# Patient Record
Sex: Female | Born: 1935 | Race: Black or African American | Hispanic: No | Marital: Single | Smoking: Former smoker
Health system: Southern US, Community
[De-identification: ages and names within clinical notes are randomized; demographics above are authoritative.]

## PROBLEM LIST (undated history)

## (undated) DIAGNOSIS — F329 Major depressive disorder, single episode, unspecified: Secondary | ICD-10-CM

## (undated) DIAGNOSIS — R569 Unspecified convulsions: Secondary | ICD-10-CM

## (undated) DIAGNOSIS — K219 Gastro-esophageal reflux disease without esophagitis: Secondary | ICD-10-CM

## (undated) DIAGNOSIS — I10 Essential (primary) hypertension: Secondary | ICD-10-CM

## (undated) DIAGNOSIS — F0390 Unspecified dementia without behavioral disturbance: Secondary | ICD-10-CM

## (undated) DIAGNOSIS — R002 Palpitations: Secondary | ICD-10-CM

## (undated) DIAGNOSIS — D649 Anemia, unspecified: Secondary | ICD-10-CM

## (undated) DIAGNOSIS — R0603 Acute respiratory distress: Secondary | ICD-10-CM

## (undated) DIAGNOSIS — F32A Depression, unspecified: Secondary | ICD-10-CM

## (undated) DIAGNOSIS — D72819 Decreased white blood cell count, unspecified: Secondary | ICD-10-CM

## (undated) DIAGNOSIS — R413 Other amnesia: Secondary | ICD-10-CM

## (undated) HISTORY — PX: EYE SURGERY: SHX253

---

## 2004-04-04 ENCOUNTER — Other Ambulatory Visit: Payer: Self-pay

## 2004-04-04 ENCOUNTER — Emergency Department: Payer: Self-pay | Admitting: General Practice

## 2004-06-05 ENCOUNTER — Ambulatory Visit: Payer: Self-pay | Admitting: Specialist

## 2004-07-15 ENCOUNTER — Emergency Department: Payer: Self-pay | Admitting: Emergency Medicine

## 2004-07-15 ENCOUNTER — Other Ambulatory Visit: Payer: Self-pay

## 2004-09-14 ENCOUNTER — Other Ambulatory Visit: Payer: Self-pay

## 2004-09-14 ENCOUNTER — Inpatient Hospital Stay: Payer: Self-pay | Admitting: Anesthesiology

## 2004-09-14 ENCOUNTER — Emergency Department: Payer: Self-pay | Admitting: Emergency Medicine

## 2004-10-31 ENCOUNTER — Emergency Department: Payer: Self-pay | Admitting: Emergency Medicine

## 2004-10-31 ENCOUNTER — Other Ambulatory Visit: Payer: Self-pay

## 2004-12-29 ENCOUNTER — Ambulatory Visit: Payer: Self-pay

## 2005-02-08 ENCOUNTER — Ambulatory Visit: Payer: Self-pay | Admitting: Pain Medicine

## 2006-02-04 ENCOUNTER — Inpatient Hospital Stay: Payer: Self-pay | Admitting: Internal Medicine

## 2006-04-19 ENCOUNTER — Ambulatory Visit: Payer: Self-pay | Admitting: Gastroenterology

## 2006-05-10 ENCOUNTER — Emergency Department: Payer: Self-pay | Admitting: General Practice

## 2006-05-10 ENCOUNTER — Other Ambulatory Visit: Payer: Self-pay

## 2007-02-27 ENCOUNTER — Other Ambulatory Visit: Payer: Self-pay

## 2007-02-27 ENCOUNTER — Inpatient Hospital Stay: Payer: Self-pay | Admitting: Internal Medicine

## 2007-04-30 ENCOUNTER — Other Ambulatory Visit: Payer: Self-pay

## 2007-04-30 ENCOUNTER — Inpatient Hospital Stay: Payer: Self-pay | Admitting: Internal Medicine

## 2007-06-25 ENCOUNTER — Other Ambulatory Visit: Payer: Self-pay

## 2007-06-25 ENCOUNTER — Observation Stay: Payer: Self-pay | Admitting: Internal Medicine

## 2007-08-09 ENCOUNTER — Ambulatory Visit: Payer: Self-pay | Admitting: Internal Medicine

## 2007-08-11 ENCOUNTER — Ambulatory Visit: Payer: Self-pay | Admitting: Internal Medicine

## 2007-09-08 ENCOUNTER — Ambulatory Visit: Payer: Self-pay | Admitting: Internal Medicine

## 2007-10-30 ENCOUNTER — Ambulatory Visit: Payer: Self-pay | Admitting: Internal Medicine

## 2007-11-08 ENCOUNTER — Ambulatory Visit: Payer: Self-pay | Admitting: Internal Medicine

## 2007-12-29 ENCOUNTER — Ambulatory Visit: Payer: Self-pay | Admitting: Internal Medicine

## 2008-01-09 ENCOUNTER — Ambulatory Visit: Payer: Self-pay | Admitting: Internal Medicine

## 2008-02-21 ENCOUNTER — Ambulatory Visit: Payer: Self-pay | Admitting: Internal Medicine

## 2008-03-01 ENCOUNTER — Emergency Department: Payer: Self-pay | Admitting: Emergency Medicine

## 2008-07-18 ENCOUNTER — Emergency Department: Payer: Self-pay | Admitting: Emergency Medicine

## 2008-09-27 ENCOUNTER — Emergency Department: Payer: Self-pay | Admitting: Emergency Medicine

## 2008-10-28 ENCOUNTER — Emergency Department: Payer: Self-pay

## 2009-02-01 ENCOUNTER — Inpatient Hospital Stay: Payer: Self-pay | Admitting: Internal Medicine

## 2009-07-25 ENCOUNTER — Inpatient Hospital Stay: Payer: Self-pay | Admitting: Internal Medicine

## 2009-10-15 ENCOUNTER — Ambulatory Visit: Payer: Self-pay | Admitting: Internal Medicine

## 2009-10-18 ENCOUNTER — Inpatient Hospital Stay: Payer: Self-pay | Admitting: Internal Medicine

## 2010-11-12 ENCOUNTER — Inpatient Hospital Stay: Payer: Self-pay | Admitting: Internal Medicine

## 2011-06-05 ENCOUNTER — Emergency Department: Payer: Self-pay | Admitting: Internal Medicine

## 2011-06-05 LAB — DRUG SCREEN, URINE
Barbiturates, Ur Screen: NEGATIVE (ref ?–200)
Cannabinoid 50 Ng, Ur ~~LOC~~: NEGATIVE (ref ?–50)
Methadone, Ur Screen: NEGATIVE (ref ?–300)
Opiate, Ur Screen: NEGATIVE (ref ?–300)
Tricyclic, Ur Screen: NEGATIVE (ref ?–1000)

## 2011-06-05 LAB — URINALYSIS, COMPLETE
Ketone: NEGATIVE
Ph: 6 (ref 4.5–8.0)
Protein: NEGATIVE
RBC,UR: 7 /HPF (ref 0–5)
Squamous Epithelial: 4
WBC UR: 38 /HPF (ref 0–5)

## 2011-06-05 LAB — COMPREHENSIVE METABOLIC PANEL
Alkaline Phosphatase: 80 U/L (ref 50–136)
Anion Gap: 14 (ref 7–16)
BUN: 14 mg/dL (ref 7–18)
Calcium, Total: 8.9 mg/dL (ref 8.5–10.1)
Chloride: 104 mmol/L (ref 98–107)
Co2: 25 mmol/L (ref 21–32)
EGFR (Non-African Amer.): >60
SGOT(AST): 47 U/L — ABNORMAL HIGH (ref 15–37)
SGPT (ALT): 34 U/L

## 2011-06-05 LAB — CBC
HGB: 12.1 g/dL (ref 12.0–16.0)
MCH: 23.5 pg — ABNORMAL LOW (ref 26.0–34.0)
MCHC: 31.1 g/dL — ABNORMAL LOW (ref 32.0–36.0)
Platelet: 142 10*3/uL — ABNORMAL LOW (ref 150–440)
RBC: 5.16 10*6/uL (ref 3.80–5.20)

## 2011-06-05 LAB — PHENYTOIN LEVEL, TOTAL: Dilantin: 3.1 ug/mL — ABNORMAL LOW (ref 10.0–20.0)

## 2011-06-05 LAB — CK TOTAL AND CKMB (NOT AT ARMC)
CK, Total: 148 U/L (ref 21–215)
CK-MB: 1.8 ng/mL (ref 0.5–3.6)

## 2011-06-05 LAB — TROPONIN I: Troponin-I: <0.02 ng/mL

## 2015-01-30 ENCOUNTER — Encounter: Payer: Self-pay | Admitting: *Deleted

## 2015-02-04 ENCOUNTER — Ambulatory Visit: Payer: Medicare (Managed Care) | Admitting: Anesthesiology

## 2015-02-04 ENCOUNTER — Encounter
Admission: RE | Disposition: A | Discharge status: Home / Self Care | Payer: Self-pay | Source: Ambulatory Visit | Attending: Ophthalmology

## 2015-02-04 ENCOUNTER — Ambulatory Visit
Admission: RE | Admit: 2015-02-04 | Discharge: 2015-02-04 | Disposition: A | Discharge status: Home / Self Care | Payer: Medicare (Managed Care) | Source: Ambulatory Visit | Attending: Ophthalmology | Admitting: Ophthalmology

## 2015-02-04 ENCOUNTER — Encounter: Payer: Self-pay | Admitting: *Deleted

## 2015-02-04 DIAGNOSIS — R413 Other amnesia: Secondary | ICD-10-CM | POA: Diagnosis not present

## 2015-02-04 DIAGNOSIS — R002 Palpitations: Secondary | ICD-10-CM | POA: Diagnosis not present

## 2015-02-04 DIAGNOSIS — I10 Essential (primary) hypertension: Secondary | ICD-10-CM | POA: Insufficient documentation

## 2015-02-04 DIAGNOSIS — H2511 Age-related nuclear cataract, right eye: Secondary | ICD-10-CM | POA: Insufficient documentation

## 2015-02-04 DIAGNOSIS — Z87891 Personal history of nicotine dependence: Secondary | ICD-10-CM | POA: Diagnosis not present

## 2015-02-04 HISTORY — DX: Unspecified convulsions: R56.9

## 2015-02-04 HISTORY — DX: Essential (primary) hypertension: I10

## 2015-02-04 HISTORY — DX: Palpitations: R00.2

## 2015-02-04 HISTORY — DX: Anemia, unspecified: D64.9

## 2015-02-04 HISTORY — PX: CATARACT EXTRACTION W/PHACO: SHX586

## 2015-02-04 HISTORY — DX: Other amnesia: R41.3

## 2015-02-04 SURGERY — PHACOEMULSIFICATION, CATARACT, WITH IOL INSERTION
Anesthesia: Monitor Anesthesia Care | Laterality: Right | Wound class: Clean

## 2015-02-04 MED ORDER — NA CHONDROIT SULF-NA HYALURON 40-17 MG/ML IO SOLN
INTRAOCULAR | Status: AC
  Filled 2015-02-04: qty 1

## 2015-02-04 MED ORDER — SODIUM CHLORIDE 0.9 % IV SOLN
INTRAVENOUS | Status: DC
  Administered 2015-02-04 (×2): via INTRAVENOUS

## 2015-02-04 MED ORDER — PHENYLEPHRINE HCL 10 % OP SOLN
OPHTHALMIC | Status: AC
  Administered 2015-02-04: 3 [drp] via OPHTHALMIC
  Filled 2015-02-04: qty 5

## 2015-02-04 MED ORDER — TRYPAN BLUE 0.06 % OP SOLN
OPHTHALMIC | Status: AC
  Filled 2015-02-04: qty 0.5

## 2015-02-04 MED ORDER — MOXIFLOXACIN HCL 0.5 % OP SOLN
OPHTHALMIC | Status: AC
  Filled 2015-02-04: qty 3

## 2015-02-04 MED ORDER — TETRACAINE HCL 0.5 % OP SOLN
1.0000 [drp] | OPHTHALMIC | Status: AC | PRN
Start: 2015-02-04 — End: 2015-02-04
  Administered 2015-02-04: 1 [drp] via OPHTHALMIC

## 2015-02-04 MED ORDER — ARMC OPHTHALMIC DILATING GEL
OPHTHALMIC | Status: AC
  Administered 2015-02-04: 1 via OPHTHALMIC
  Filled 2015-02-04: qty 0.25

## 2015-02-04 MED ORDER — CEFUROXIME OPHTHALMIC INJECTION 1 MG/0.1 ML
INJECTION | OPHTHALMIC | Status: AC
  Filled 2015-02-04: qty 0.1

## 2015-02-04 MED ORDER — EPINEPHRINE HCL 1 MG/ML IJ SOLN
INTRAMUSCULAR | Status: DC | PRN
  Administered 2015-02-04: 200 mL via OPHTHALMIC

## 2015-02-04 MED ORDER — MOXIFLOXACIN HCL 0.5 % OP SOLN
OPHTHALMIC | Status: DC | PRN
  Administered 2015-02-04: 1 [drp] via OPHTHALMIC

## 2015-02-04 MED ORDER — EPINEPHRINE HCL 1 MG/ML IJ SOLN
INTRAMUSCULAR | Status: AC
  Filled 2015-02-04: qty 1

## 2015-02-04 MED ORDER — ARMC OPHTHALMIC DILATING GEL
1.0000 "application " | OPHTHALMIC | Status: AC | PRN
  Administered 2015-02-04 (×2): 1 via OPHTHALMIC

## 2015-02-04 MED ORDER — POVIDONE-IODINE 5 % OP SOLN
1.0000 "application " | OPHTHALMIC | Status: AC | PRN
  Administered 2015-02-04: 1 via OPHTHALMIC

## 2015-02-04 MED ORDER — PHENYLEPHRINE HCL 10 % OP SOLN
3.0000 [drp] | Freq: Once | OPHTHALMIC | Status: AC
  Administered 2015-02-04: 3 [drp] via OPHTHALMIC

## 2015-02-04 MED ORDER — CEFUROXIME OPHTHALMIC INJECTION 1 MG/0.1 ML
INJECTION | OPHTHALMIC | Status: DC | PRN
  Administered 2015-02-04: 1 mg via INTRACAMERAL

## 2015-02-04 MED ORDER — MOXIFLOXACIN HCL 0.5 % OP SOLN: 1.0000 [drp] | OPHTHALMIC | Status: DC | PRN

## 2015-02-04 MED ORDER — MIDAZOLAM HCL 2 MG/2ML IJ SOLN
INTRAMUSCULAR | Status: DC | PRN
  Administered 2015-02-04: 1 mg via INTRAVENOUS

## 2015-02-04 MED ORDER — NA CHONDROIT SULF-NA HYALURON 40-17 MG/ML IO SOLN
INTRAOCULAR | Status: DC | PRN
  Administered 2015-02-04: 1 mL via INTRAOCULAR

## 2015-02-04 MED ORDER — TETRACAINE HCL 0.5 % OP SOLN
OPHTHALMIC | Status: AC
  Administered 2015-02-04: 1 [drp] via OPHTHALMIC
  Filled 2015-02-04: qty 2

## 2015-02-04 MED ORDER — TRYPAN BLUE 0.06 % OP SOLN
OPHTHALMIC | Status: DC | PRN
  Administered 2015-02-04: 0.5 mL via INTRAOCULAR

## 2015-02-04 SURGICAL SUPPLY — 24 items
CANNULA ANT/CHMB 27GA (MISCELLANEOUS) ×3 IMPLANT
CUP MEDICINE 2OZ PLAST GRAD ST (MISCELLANEOUS) ×3 IMPLANT
FILTER MILLEX .045 (MISCELLANEOUS) ×1 IMPLANT
FLTR MILLEX .045 (MISCELLANEOUS) ×3
GLOVE BIO SURGEON STRL SZ8 (GLOVE) ×3 IMPLANT
GLOVE BIOGEL M 6.5 STRL (GLOVE) ×3 IMPLANT
GLOVE SURG LX 8.0 MICRO (GLOVE) ×2
GLOVE SURG LX STRL 8.0 MICRO (GLOVE) ×1 IMPLANT
GOWN STRL REUS W/ TWL LRG LVL3 (GOWN DISPOSABLE) ×2 IMPLANT
GOWN STRL REUS W/TWL LRG LVL3 (GOWN DISPOSABLE) ×4
LENS IOL TECNIS 23.5 (Intraocular Lens) ×3 IMPLANT
LENS IOL TECNIS MONO 1P 23.5 (Intraocular Lens) ×1 IMPLANT
PACK CATARACT (MISCELLANEOUS) ×3 IMPLANT
PACK CATARACT BRASINGTON LX (MISCELLANEOUS) ×3 IMPLANT
PACK EYE AFTER SURG (MISCELLANEOUS) ×3 IMPLANT
SOL BSS BAG (MISCELLANEOUS) ×3
SOL PREP PVP 2OZ (MISCELLANEOUS) ×3
SOLUTION BSS BAG (MISCELLANEOUS) ×1 IMPLANT
SOLUTION PREP PVP 2OZ (MISCELLANEOUS) ×1 IMPLANT
SYR 3ML LL SCALE MARK (SYRINGE) ×3 IMPLANT
SYR 5ML LL (SYRINGE) ×3 IMPLANT
SYR TB 1ML 27GX1/2 LL (SYRINGE) ×3 IMPLANT
WATER STERILE IRR 1000ML POUR (IV SOLUTION) ×3 IMPLANT
WIPE NON LINTING 3.25X3.25 (MISCELLANEOUS) ×3 IMPLANT

## 2015-02-04 NOTE — Op Note (Signed)
PREOPERATIVE DIAGNOSIS:  Nuclear sclerotic cataract of the right eye.   POSTOPERATIVE DIAGNOSIS: cataract right eye   OPERATIVE PROCEDURE:  Procedure(s): CATARACT EXTRACTION PHACO AND INTRAOCULAR LENS PLACEMENT (IOC)   SURGEON:  Galen Manila, MD.   ANESTHESIA:  Anesthesiologist: Lenard Simmer, MD CRNA: Mathews Argyle, CRNA  1.      Managed anesthesia care. 2.      Topical tetracaine drops followed by 2% Xylocaine jelly applied in the preoperative holding area.   COMPLICATIONS:  None.   TECHNIQUE:   Stop and chop   DESCRIPTION OF PROCEDURE:  The patient was examined and consented in the preoperative holding area where the aforementioned topical anesthesia was applied to the right eye and then brought back to the Operating Room where the right eye was prepped and draped in the usual sterile ophthalmic fashion and a lid speculum was placed. A paracentesis was created with the side port blade. Vision Blue was used to stain the anterior capsule due to very poor/ no visualization of the red reflex.  The anterior chamber was filled with viscoelastic. A near clear corneal incision was performed with the steel keratome. A continuous curvilinear capsulorrhexis was performed with a cystotome followed by the capsulorrhexis forceps. Hydrodissection and hydrodelineation were carried out with BSS on a blunt cannula. The lens was removed in a stop and chop  technique and the remaining cortical material was removed with the irrigation-aspiration handpiece. The capsular bag was inflated with viscoelastic and the Technis ZCB00  lens was placed in the capsular bag without complication. The remaining viscoelastic was removed from the eye with the irrigation-aspiration handpiece. The wounds were hydrated. The anterior chamber was flushed with Miostat and the eye was inflated to physiologic pressure. 0.1 mL of cefuroxime concentration 10 mg/mL was placed in the anterior chamber. The wounds were found to be water  tight. The eye was dressed with Vigamox. The patient was given protective glasses to wear throughout the day and a shield with which to sleep tonight. The patient was also given drops with which to begin a drop regimen today and will follow-up with me in one day.  Implant Name Type Inv. Item Serial No. Manufacturer Lot No. LRB No. Used  LENS IMPL INTRAOC ZCB00 23.5 - [id number] Intraocular Lens LENS IMPL INTRAOC ZCB00 23.5 [id number]   Right 1   Procedure(s) with comments: CATARACT EXTRACTION PHACO AND INTRAOCULAR LENS PLACEMENT (IOC) (Right) - [place] 1:16 ap 49.1 cde 19.71 fluid lot # [id number]  Electronically signed: PORFILIO,WILLIAM LOUIS 02/04/2015 11:44 AM

## 2015-02-04 NOTE — Transfer of Care (Signed)
Immediate Anesthesia Transfer of Care Note  Patient: Tammy Clayton  Procedure(s) Performed: Procedure(s) with comments: CATARACT EXTRACTION PHACO AND INTRAOCULAR LENS PLACEMENT (IOC) (Right) - [place] 1:16 ap 49.1 cde 19.71 fluid lot # [id number]  Patient Location: Short Stay  Anesthesia Type:MAC  Level of Consciousness: awake  Airway & Oxygen Therapy: Patient Spontanous Breathing  Post-op Assessment: Report given to RN  Post vital signs: Reviewed  Last Vitals:  Filed Vitals:   02/04/15 1147  BP: 159/84  Pulse: 62  Temp: 36.5 C  Resp: 16    Complications: No apparent anesthesia complications

## 2015-02-04 NOTE — H&P (Signed)
  All labs reviewed. Abnormal studies sent to patients PCP when indicated.  Previous H&P reviewed, patient examined, there are NO CHANGES.  Tammy Clayton LOUIS9/27/201610:49 AM

## 2015-02-04 NOTE — Discharge Instructions (Signed)
Eye Surgery Discharge Instructions  Expect mild scratchy sensation or mild soreness. DO NOT RUB YOUR EYE!  The day of surgery:  Minimal physical activity, but bed rest is not required  No reading, computer work, or close hand work  No bending, lifting, or straining.  May watch TV  For 24 hours:  No driving, legal decisions, or alcoholic beverages  Safety precautions  Eat anything you prefer: It is better to start with liquids, then soup then solid foods.  _____ Eye patch should be worn until postoperative exam tomorrow.  ____ Solar shield eyeglasses should be worn for comfort in the sunlight/patch while sleeping  Resume all regular medications including aspirin or Coumadin if these were discontinued prior to surgery. You may shower, bathe, shave, or wash your hair. Tylenol may be taken for mild discomfort.  Call your doctor if you experience significant pain, nausea, or vomiting, fever > 101 or other signs of infection. [phone] or [phone] Specific instructions:  Follow-up Information    Follow up with Carlena Bjornstad, MD.   Specialty:  Ophthalmology   Why:  9/28 at 1045   Contact information:   [address] [place] [address] [phone]

## 2015-02-04 NOTE — Anesthesia Postprocedure Evaluation (Signed)
  Anesthesia Post-op Note  Patient: Tammy Clayton  Procedure(s) Performed: Procedure(s) with comments: CATARACT EXTRACTION PHACO AND INTRAOCULAR LENS PLACEMENT (IOC) (Right) - [place] 1:16 ap 49.1 cde 19.71 fluid lot # [id number]  Anesthesia type:MAC  Patient location: short stay  Post pain: Pain level controlled  Post assessment: Post-op Vital signs reviewed, Patient's Cardiovascular Status Stable, Respiratory Function Stable, Patent Airway and No signs of Nausea or vomiting  Post vital signs: Reviewed and stable  Last Vitals:  Filed Vitals:   02/04/15 1147  BP: 159/84  Pulse: 62  Temp: 36.5 C  Resp: 16    Level of consciousness: awake, alert  and patient cooperative  Complications: No apparent anesthesia complications

## 2015-02-04 NOTE — Anesthesia Preprocedure Evaluation (Signed)
Anesthesia Evaluation  Patient identified by MRN, date of birth, ID band Patient awake    Reviewed: Allergy & Precautions, H&P , NPO status , Patient's Chart, lab work & pertinent test results, reviewed documented beta blocker date and time   History of Anesthesia Complications Negative for: history of anesthetic complications  Airway Mallampati: IV  TM Distance: >3 FB Neck ROM: full    Dental no notable dental hx. (+) Upper Dentures, Missing, Poor Dentition   Pulmonary neg shortness of breath, neg sleep apnea, neg COPD, neg recent URI, former smoker,    Pulmonary exam normal breath sounds clear to auscultation       Cardiovascular Exercise Tolerance: Good hypertension, On Medications (-) angina(-) CAD, (-) Past MI, (-) Cardiac Stents and (-) CABG Normal cardiovascular exam(-) dysrhythmias (-) Valvular Problems/Murmurs Rhythm:regular Rate:Normal     Neuro/Psych Seizures -, Well Controlled,  negative psych ROS   GI/Hepatic negative GI ROS, Neg liver ROS,   Endo/Other  negative endocrine ROS  Renal/GU negative Renal ROS  negative genitourinary   Musculoskeletal   Abdominal   Peds  Hematology negative hematology ROS (+)   Anesthesia Other Findings Past Medical History:   Hypertension                                                 Seizures                                                     Anemia                                                       Memory change                                                Palpitations                                                 Reproductive/Obstetrics negative OB ROS                             Anesthesia Physical Anesthesia Plan  ASA: II  Anesthesia Plan: MAC   Post-op Pain Management:    Induction:   Airway Management Planned:   Additional Equipment:   Intra-op Plan:   Post-operative Plan:   Informed Consent: I have reviewed the  patients History and Physical, chart, labs and discussed the procedure including the risks, benefits and alternatives for the proposed anesthesia with the patient or authorized representative who has indicated his/her understanding and acceptance.   Dental Advisory Given  Plan Discussed with: Anesthesiologist, CRNA and Surgeon  Anesthesia Plan Comments:         Anesthesia Quick Evaluation

## 2015-03-24 ENCOUNTER — Other Ambulatory Visit: Payer: Self-pay | Admitting: Family

## 2015-03-24 DIAGNOSIS — R569 Unspecified convulsions: Secondary | ICD-10-CM

## 2015-04-24 ENCOUNTER — Encounter: Payer: Self-pay | Admitting: *Deleted

## 2015-04-29 ENCOUNTER — Ambulatory Visit: Payer: Medicare (Managed Care) | Admitting: Certified Registered Nurse Anesthetist

## 2015-04-29 ENCOUNTER — Ambulatory Visit
Admission: RE | Admit: 2015-04-29 | Discharge: 2015-04-29 | Disposition: A | Discharge status: Home / Self Care | Payer: Medicare (Managed Care) | Source: Ambulatory Visit | Attending: Ophthalmology | Admitting: Ophthalmology

## 2015-04-29 ENCOUNTER — Encounter
Admission: RE | Disposition: A | Discharge status: Home / Self Care | Payer: Self-pay | Source: Ambulatory Visit | Attending: Ophthalmology

## 2015-04-29 ENCOUNTER — Encounter: Payer: Self-pay | Admitting: *Deleted

## 2015-04-29 DIAGNOSIS — F329 Major depressive disorder, single episode, unspecified: Secondary | ICD-10-CM | POA: Diagnosis not present

## 2015-04-29 DIAGNOSIS — D649 Anemia, unspecified: Secondary | ICD-10-CM | POA: Diagnosis not present

## 2015-04-29 DIAGNOSIS — F0390 Unspecified dementia without behavioral disturbance: Secondary | ICD-10-CM | POA: Diagnosis not present

## 2015-04-29 DIAGNOSIS — H2512 Age-related nuclear cataract, left eye: Secondary | ICD-10-CM | POA: Insufficient documentation

## 2015-04-29 DIAGNOSIS — Z9841 Cataract extraction status, right eye: Secondary | ICD-10-CM | POA: Diagnosis not present

## 2015-04-29 DIAGNOSIS — E7800 Pure hypercholesterolemia, unspecified: Secondary | ICD-10-CM | POA: Diagnosis not present

## 2015-04-29 DIAGNOSIS — K219 Gastro-esophageal reflux disease without esophagitis: Secondary | ICD-10-CM | POA: Diagnosis not present

## 2015-04-29 DIAGNOSIS — Z87891 Personal history of nicotine dependence: Secondary | ICD-10-CM | POA: Insufficient documentation

## 2015-04-29 DIAGNOSIS — I10 Essential (primary) hypertension: Secondary | ICD-10-CM | POA: Diagnosis not present

## 2015-04-29 HISTORY — PX: CATARACT EXTRACTION W/PHACO: SHX586

## 2015-04-29 HISTORY — DX: Gastro-esophageal reflux disease without esophagitis: K21.9

## 2015-04-29 HISTORY — DX: Acute respiratory distress: R06.03

## 2015-04-29 HISTORY — DX: Depression, unspecified: F32.A

## 2015-04-29 HISTORY — DX: Major depressive disorder, single episode, unspecified: F32.9

## 2015-04-29 HISTORY — DX: Unspecified dementia, unspecified severity, without behavioral disturbance, psychotic disturbance, mood disturbance, and anxiety: F03.90

## 2015-04-29 HISTORY — DX: Decreased white blood cell count, unspecified: D72.819

## 2015-04-29 SURGERY — PHACOEMULSIFICATION, CATARACT, WITH IOL INSERTION
Anesthesia: Monitor Anesthesia Care | Site: Eye | Laterality: Left | Wound class: Clean

## 2015-04-29 MED ORDER — NA CHONDROIT SULF-NA HYALURON 40-17 MG/ML IO SOLN
INTRAOCULAR | Status: DC | PRN
  Administered 2015-04-29: 1 mL via INTRAOCULAR

## 2015-04-29 MED ORDER — CARBACHOL 0.01 % IO SOLN
INTRAOCULAR | Status: DC | PRN
  Administered 2015-04-29: .5 mL via INTRAOCULAR

## 2015-04-29 MED ORDER — MOXIFLOXACIN HCL 0.5 % OP SOLN
OPHTHALMIC | Status: AC
  Filled 2015-04-29: qty 3

## 2015-04-29 MED ORDER — POVIDONE-IODINE 5 % OP SOLN
1.0000 | OPHTHALMIC | Status: AC | PRN
Start: 2015-04-29 — End: 2015-04-29

## 2015-04-29 MED ORDER — TETRACAINE HCL 0.5 % OP SOLN
OPHTHALMIC | Status: AC
  Administered 2015-04-29: 1 [drp] via OPHTHALMIC
  Filled 2015-04-29: qty 2

## 2015-04-29 MED ORDER — ARMC OPHTHALMIC DILATING GEL
1.0000 "application " | OPHTHALMIC | Status: DC | PRN
  Administered 2015-04-29 (×2): 1 via OPHTHALMIC

## 2015-04-29 MED ORDER — ARMC OPHTHALMIC DILATING GEL
OPHTHALMIC | Status: AC
  Administered 2015-04-29: 1 via OPHTHALMIC
  Filled 2015-04-29: qty 0.25

## 2015-04-29 MED ORDER — EPINEPHRINE HCL 1 MG/ML IJ SOLN
INTRAMUSCULAR | Status: AC
  Filled 2015-04-29: qty 1

## 2015-04-29 MED ORDER — MOXIFLOXACIN HCL 0.5 % OP SOLN
1.0000 [drp] | OPHTHALMIC | Status: DC | PRN
Start: 2015-04-29 — End: 2015-04-29

## 2015-04-29 MED ORDER — POVIDONE-IODINE 5 % OP SOLN
OPHTHALMIC | Status: AC
  Administered 2015-04-29: 1 via OPHTHALMIC
  Filled 2015-04-29: qty 30

## 2015-04-29 MED ORDER — NA CHONDROIT SULF-NA HYALURON 40-17 MG/ML IO SOLN
INTRAOCULAR | Status: AC
  Filled 2015-04-29: qty 1

## 2015-04-29 MED ORDER — SODIUM CHLORIDE 0.9 % IV SOLN
INTRAVENOUS | Status: DC
  Administered 2015-04-29: 08:00:00 via INTRAVENOUS

## 2015-04-29 MED ORDER — CEFUROXIME OPHTHALMIC INJECTION 1 MG/0.1 ML
INJECTION | OPHTHALMIC | Status: AC
  Filled 2015-04-29: qty 0.1

## 2015-04-29 MED ORDER — CEFUROXIME OPHTHALMIC INJECTION 1 MG/0.1 ML
INJECTION | OPHTHALMIC | Status: DC | PRN
  Administered 2015-04-29: .1 mL via INTRACAMERAL

## 2015-04-29 MED ORDER — EPINEPHRINE HCL 1 MG/ML IJ SOLN
INTRAOCULAR | Status: DC | PRN
  Administered 2015-04-29: 1 mL via OPHTHALMIC

## 2015-04-29 MED ORDER — MOXIFLOXACIN HCL 0.5 % OP SOLN
OPHTHALMIC | Status: DC | PRN
  Administered 2015-04-29: 1 [drp] via OPHTHALMIC

## 2015-04-29 MED ORDER — POVIDONE-IODINE 5 % OP SOLN
1.0000 "application " | OPHTHALMIC | Status: AC | PRN
  Administered 2015-04-29: 1 via OPHTHALMIC

## 2015-04-29 MED ORDER — TETRACAINE HCL 0.5 % OP SOLN
1.0000 [drp] | OPHTHALMIC | Status: AC | PRN
  Administered 2015-04-29: 1 [drp] via OPHTHALMIC

## 2015-04-29 MED ORDER — FENTANYL CITRATE (PF) 100 MCG/2ML IJ SOLN
INTRAMUSCULAR | Status: DC | PRN
  Administered 2015-04-29: 25 ug via INTRAVENOUS

## 2015-04-29 SURGICAL SUPPLY — 22 items
CANNULA ANT/CHMB 27GA (MISCELLANEOUS) ×3 IMPLANT
CUP MEDICINE 2OZ PLAST GRAD ST (MISCELLANEOUS) ×3 IMPLANT
GLOVE BIO SURGEON STRL SZ8 (GLOVE) ×3 IMPLANT
GLOVE BIOGEL M 6.5 STRL (GLOVE) ×3 IMPLANT
GLOVE SURG LX 8.0 MICRO (GLOVE) ×2
GLOVE SURG LX STRL 8.0 MICRO (GLOVE) ×1 IMPLANT
GOWN STRL REUS W/ TWL LRG LVL3 (GOWN DISPOSABLE) ×2 IMPLANT
GOWN STRL REUS W/TWL LRG LVL3 (GOWN DISPOSABLE) ×4
LENS IOL TECNIS 23.5 (Intraocular Lens) ×3 IMPLANT
LENS IOL TECNIS MONO 1P 23.5 (Intraocular Lens) ×1 IMPLANT
PACK CATARACT (MISCELLANEOUS) ×3 IMPLANT
PACK CATARACT BRASINGTON LX (MISCELLANEOUS) ×3 IMPLANT
PACK EYE AFTER SURG (MISCELLANEOUS) ×3 IMPLANT
SOL BSS BAG (MISCELLANEOUS) ×3
SOL PREP PVP 2OZ (MISCELLANEOUS) ×3
SOLUTION BSS BAG (MISCELLANEOUS) ×1 IMPLANT
SOLUTION PREP PVP 2OZ (MISCELLANEOUS) ×1 IMPLANT
SYR 3ML LL SCALE MARK (SYRINGE) ×3 IMPLANT
SYR 5ML LL (SYRINGE) ×3 IMPLANT
SYR TB 1ML 27GX1/2 LL (SYRINGE) ×3 IMPLANT
WATER STERILE IRR 1000ML POUR (IV SOLUTION) ×3 IMPLANT
WIPE NON LINTING 3.25X3.25 (MISCELLANEOUS) ×3 IMPLANT

## 2015-04-29 NOTE — Discharge Instructions (Signed)
Eye Surgery Discharge Instructions  Expect mild scratchy sensation or mild soreness. DO NOT RUB YOUR EYE!  The day of surgery:  Minimal physical activity, but bed rest is not required  No reading, computer work, or close hand work  No bending, lifting, or straining.  May watch TV  For 24 hours:  No driving, legal decisions, or alcoholic beverages  Safety precautions  Eat anything you prefer: It is better to start with liquids, then soup then solid foods.  _____ Eye patch should be worn until postoperative exam tomorrow.  ____ Solar shield eyeglasses should be worn for comfort in the sunlight/patch while sleeping  Resume all regular medications including aspirin or Coumadin if these were discontinued prior to surgery. You may shower, bathe, shave, or wash your hair. Tylenol may be taken for mild discomfort.  Call your doctor if you experience significant pain, nausea, or vomiting, fever > 101 or other signs of infection. [phone] or [phone] Specific instructions:  Follow-up Information    Follow up with Carlena BjornstadPORFILIO,WILLIAM LOUIS, MD On 04/30/2015.   Specialty:  Ophthalmology   Why:  1:20   Contact information:   [address] [place] [id number] [phone]

## 2015-04-29 NOTE — Transfer of Care (Signed)
Immediate Anesthesia Transfer of Care Note  Patient: Tammy Clayton  Procedure(s) Performed: Procedure(s) with comments: CATARACT EXTRACTION PHACO AND INTRAOCULAR LENS PLACEMENT (IOC) (Left) - US 00:53 AP% 21.6 CDE 11.51 fluid pack lot # [id number]  Patient Location: PACU  Anesthesia Type:MAC  Level of Consciousness: awake and alert   Airway & Oxygen Therapy: Patient Spontanous Breathing  Post-op Assessment: Report given to RN and Post -op Vital signs reviewed and stable  Post vital signs: Reviewed and stable  Last Vitals:  Filed Vitals:   04/29/15 0750 04/29/15 0925  BP: 137/82 169/76  Pulse: 62 66  Temp: 36.6 C 36.5 C  Resp: 18 18    Complications: No apparent anesthesia complications

## 2015-04-29 NOTE — Progress Notes (Signed)
[facility] dropped [facility] at [facility] per patient. Knew she was having cataract surgery, but didn't know it was today. No family with her at this time. Patient supplied number for her sister [name] who knew she was having surgery today and will be here around 10 am to go over discharge instructions and take patient home per sister [name]. Patient reoriented a few times during pre op.

## 2015-04-29 NOTE — Anesthesia Preprocedure Evaluation (Addendum)
Anesthesia Evaluation  Patient identified by MRN, date of birth, ID band Patient awake    Reviewed: Allergy & Precautions, NPO status , Patient's Chart, lab work & pertinent test results  Airway Mallampati: II  TM Distance: >3 FB Neck ROM: Full    Dental no notable dental hx.    Pulmonary former smoker,    Pulmonary exam normal        Cardiovascular hypertension, Pt. on medications Normal cardiovascular exam     Neuro/Psych Seizures -, Well Controlled,  Depression    GI/Hepatic Neg liver ROS, GERD  Medicated and Controlled,  Endo/Other  negative endocrine ROS  Renal/GU negative Renal ROS  negative genitourinary   Musculoskeletal   Abdominal Normal abdominal exam  (+)   Peds negative pediatric ROS (+)  Hematology  (+) anemia ,   Anesthesia Other Findings   Reproductive/Obstetrics                           Anesthesia Physical Anesthesia Plan  ASA: III  Anesthesia Plan: MAC   Post-op Pain Management:    Induction: Intravenous  Airway Management Planned: Nasal Cannula  Additional Equipment:   Intra-op Plan:   Post-operative Plan:   Informed Consent: I have reviewed the patients History and Physical, chart, labs and discussed the procedure including the risks, benefits and alternatives for the proposed anesthesia with the patient or authorized representative who has indicated his/her understanding and acceptance.   Dental advisory given  Plan Discussed with: CRNA and Surgeon  Anesthesia Plan Comments: (Patient does not cook for herself and is somewhat unreliable....swelling of the ankles sister cooks for her)       Anesthesia Quick Evaluation

## 2015-04-29 NOTE — H&P (Signed)
  All labs reviewed. Abnormal studies sent to patients PCP when indicated.  Previous H&P reviewed, patient examined, there are NO CHANGES.  Tammy Heuer LOUIS12/20/20169:23 AM

## 2015-04-29 NOTE — Progress Notes (Signed)
ANesthesia in to assess patient prior to surgery. Patient confused about eating this morning despite telling nurse this morning she has not ate since last night. RN called sister to verify whether or not she has had meals this morning. Sister [name] states that patient Tammy Clayton does not cook for herself or make meals that her family does this for her and her brother whom she lives with and that patient did not eat anything this morning nor have any of her medications. Sister [name] stated that she is on her way and will arrive at hospital within 30 minutes. Anesthesia notified of this and to proceed with case.

## 2015-04-29 NOTE — Anesthesia Postprocedure Evaluation (Signed)
Anesthesia Post Note  Patient: Tammy Clayton  Procedure(s) Performed: Procedure(s) (LRB): CATARACT EXTRACTION PHACO AND INTRAOCULAR LENS PLACEMENT (IOC) (Left)  Patient location during evaluation: PACU Anesthesia Type: MAC Level of consciousness: awake and alert Pain management: pain level controlled Vital Signs Assessment: post-procedure vital signs reviewed and stable Respiratory status: spontaneous breathing Cardiovascular status: stable Anesthetic complications: no    Last Vitals:  Filed Vitals:   04/29/15 0750 04/29/15 0925  BP: 137/82 169/76  Pulse: 62 66  Temp: 36.6 C 36.5 C  Resp: 18 18    Last Pain: There were no vitals filed for this visit.               Clydene PughBeane, Raiden Haydu D

## 2015-04-29 NOTE — Op Note (Signed)
PREOPERATIVE DIAGNOSIS:  Nuclear sclerotic cataract of the left eye.   POSTOPERATIVE DIAGNOSIS:  nuclear sclerotic cataract left eye   OPERATIVE PROCEDURE:  Procedure(s): CATARACT EXTRACTION PHACO AND INTRAOCULAR LENS PLACEMENT (IOC)   SURGEON:  Galen ManilaWilliam Cola Gane, MD.   ANESTHESIA:   Anesthesiologist: Yves DillPaul Carroll, MD CRNA: Malva Coganatherine Beane, CRNA  1.      Managed anesthesia care. 2.      Topical tetracaine drops followed by 2% Xylocaine jelly applied in the preoperative holding area.   COMPLICATIONS:  None.   TECHNIQUE:   Stop and chop   DESCRIPTION OF PROCEDURE:  The patient was examined and consented in the preoperative holding area where the aforementioned topical anesthesia was applied to the left eye and then brought back to the Operating Room where the left eye was prepped and draped in the usual sterile ophthalmic fashion and a lid speculum was placed. A paracentesis was created with the side port blade and the anterior chamber was filled with viscoelastic. A near clear corneal incision was performed with the steel keratome. A continuous curvilinear capsulorrhexis was performed with a cystotome followed by the capsulorrhexis forceps. Hydrodissection and hydrodelineation were carried out with BSS on a blunt cannula. The lens was removed in a stop and chop  technique and the remaining cortical material was removed with the irrigation-aspiration handpiece. The capsular bag was inflated with viscoelastic and the Technis ZCB00 lens was placed in the capsular bag without complication. The remaining viscoelastic was removed from the eye with the irrigation-aspiration handpiece. The wounds were hydrated. The anterior chamber was flushed with Miostat and the eye was inflated to physiologic pressure. 0.1 mL of cefuroxime concentration 10 mg/mL was placed in the anterior chamber. The wounds were found to be water tight. The eye was dressed with Vigamox. The patient was given protective glasses to wear  throughout the day and a shield with which to sleep tonight. The patient was also given drops with which to begin a drop regimen today and will follow-up with me in one day.  Implant Name Type Inv. Item Serial No. Manufacturer Lot No. LRB No. Used  LENS IMPL INTRAOC ZCB00 23.5 - [id number] Intraocular Lens LENS IMPL INTRAOC ZCB00 23.5 [id number] AMO   Left 1   Procedure(s) with comments: CATARACT EXTRACTION PHACO AND INTRAOCULAR LENS PLACEMENT (IOC) (Left) - US 00:53 AP% 21.6 CDE 11.51 fluid pack lot # [id number]  Electronically signed: Chi Woodham LOUIS 04/29/2015 9:24 AM

## 2015-04-29 NOTE — Anesthesia Procedure Notes (Signed)
Procedure Name: MAC Performed by: Anneliese Leblond Pre-anesthesia Checklist: Patient identified, Emergency Drugs available, Suction available, Patient being monitored and Timeout performed Oxygen Delivery Method: Nasal cannula       

## 2015-05-20 ENCOUNTER — Ambulatory Visit: Admission: RE | Admit: 2015-05-20 | Payer: Medicare (Managed Care) | Source: Ambulatory Visit

## 2017-03-20 ENCOUNTER — Other Ambulatory Visit: Payer: Self-pay

## 2017-03-20 ENCOUNTER — Emergency Department
Admission: EM | Admit: 2017-03-20 | Discharge: 2017-03-20 | Disposition: A | Discharge status: Home / Self Care | Payer: Medicare (Managed Care) | Attending: Emergency Medicine | Admitting: Emergency Medicine

## 2017-03-20 ENCOUNTER — Encounter: Payer: Self-pay | Admitting: Emergency Medicine

## 2017-03-20 ENCOUNTER — Emergency Department: Payer: Medicare (Managed Care)

## 2017-03-20 DIAGNOSIS — Z79899 Other long term (current) drug therapy: Secondary | ICD-10-CM | POA: Diagnosis not present

## 2017-03-20 DIAGNOSIS — Y998 Other external cause status: Secondary | ICD-10-CM | POA: Diagnosis not present

## 2017-03-20 DIAGNOSIS — Z87891 Personal history of nicotine dependence: Secondary | ICD-10-CM | POA: Insufficient documentation

## 2017-03-20 DIAGNOSIS — W19XXXA Unspecified fall, initial encounter: Secondary | ICD-10-CM | POA: Insufficient documentation

## 2017-03-20 DIAGNOSIS — S52502A Unspecified fracture of the lower end of left radius, initial encounter for closed fracture: Secondary | ICD-10-CM | POA: Diagnosis not present

## 2017-03-20 DIAGNOSIS — Y929 Unspecified place or not applicable: Secondary | ICD-10-CM | POA: Diagnosis not present

## 2017-03-20 DIAGNOSIS — Y939 Activity, unspecified: Secondary | ICD-10-CM | POA: Diagnosis not present

## 2017-03-20 DIAGNOSIS — S0990XA Unspecified injury of head, initial encounter: Secondary | ICD-10-CM | POA: Insufficient documentation

## 2017-03-20 DIAGNOSIS — S6992XA Unspecified injury of left wrist, hand and finger(s), initial encounter: Secondary | ICD-10-CM | POA: Diagnosis present

## 2017-03-20 MED ORDER — IBUPROFEN 600 MG PO TABS
600.0000 mg | ORAL_TABLET | Freq: Once | ORAL | Status: AC
  Administered 2017-03-20: 600 mg via ORAL
  Filled 2017-03-20: qty 1

## 2017-03-20 NOTE — ED Triage Notes (Signed)
Pt to ED via POV stating that she fell last night and has been having pain and swelling in her left wrist since falling. Pt states that she is unsure if she hit her head. Pt does take Aspirin 81 mg QD. Pt has significant swelling to her left wrist. Strong radial pulse noted, cap refill less than 3 seconds, skin warm to touch. Pt is in NAD at this time.

## 2017-03-20 NOTE — ED Notes (Signed)
Pt d/c with sister-guardian on file.

## 2017-03-20 NOTE — ED Provider Notes (Signed)
[facility] Emergency Department Provider Note ____________________________________________   I have reviewed the triage vital signs and the triage nursing note.  HISTORY  Chief Complaint Wrist Pain and Fall   Historian Patient and sister  HPI Tammy Clayton is a 83 y.o. female had a mechanical fall yesterday and left wrist is swollen significantly.  Unclear whether or not she struck her head, but it sounds like she did.  No other pains or extremity injuries other than that left wrist.  Pain is moderate.  Swelling is moderate.  Movement makes it worse.   Past Medical History:  Diagnosis Date  . Anemia   . Dementia   . Depression   . GERD (gastroesophageal reflux disease)   . Hypertension   . Leukopenia    chronic  . Memory change   . Palpitations   . Respiratory difficulty    h/o failure  . Seizures (HCC)     There are no active problems to display for this patient.   Past Surgical History:  Procedure Laterality Date  . EYE SURGERY      Prior to Admission medications   Medication Sig Start Date End Date Taking? Authorizing Provider  atorvastatin (LIPITOR) 40 MG tablet Take 40 mg by mouth daily.    [provider]  citalopram (CELEXA) 20 MG tablet Take 20 mg by mouth daily.    [provider]  ferrous sulfate 325 (65 FE) MG tablet Take 325 mg by mouth daily with breakfast.    [provider]  levETIRAcetam (KEPPRA) 750 MG tablet Take 750 mg by mouth 2 (two) times daily.    [provider]  loratadine (CLARITIN) 10 MG tablet Take 10 mg by mouth daily.    [provider]  meloxicam (MOBIC) 7.5 MG tablet Take 7.5 mg by mouth daily.    [provider]  omeprazole (PRILOSEC) 40 MG capsule Take 40 mg by mouth daily.    [provider]  phenytoin (DILANTIN) 100 MG ER capsule Take 300 mg by mouth at bedtime.     [provider]    No Known Allergies  No family history on  file.  Social History Social History   Tobacco Use  . Smoking status: Former Smoker  Substance Use Topics  . Alcohol use: No  . Drug use: No    Review of Systems  Constitutional: Negative for fever. Eyes: Negative for visual changes. ENT: Negative for sore throat. Cardiovascular: Negative for chest pain. Respiratory: Negative for shortness of breath. Gastrointestinal: Negative for abdominal pain, vomiting and diarrhea. Genitourinary: Negative for dysuria. Musculoskeletal: Negative for back pain. Skin: Negative for rash. Neurological: Negative for headache.  ____________________________________________   PHYSICAL EXAM:  VITAL SIGNS: ED Triage Vitals  Enc Vitals Group     BP 03/20/17 1157 (!) 143/116     Pulse Rate 03/20/17 1157 94     Resp 03/20/17 1157 16     Temp 03/20/17 1157 99.1 F (37.3 C)     Temp Source 03/20/17 1157 Oral     SpO2 03/20/17 1157 100 %     Weight 03/20/17 1157 137 lb (62.1 kg)     Height --      Head Circumference --      Peak Flow --      Pain Score 03/20/17 1156 10     Pain Loc --      Pain Edu? --      Excl. in GC? --  Constitutional: Alert and oriented. Well appearing and in no distress. HEENT   Head: Normocephalic and atraumatic.      Eyes: Conjunctivae are normal. Pupils equal and round.       Ears:         Nose: No congestion/rhinnorhea.   Mouth/Throat: Mucous membranes are moist.   Neck: No stridor. Cardiovascular/Chest: Normal rate, regular rhythm.  No murmurs, rubs, or gallops. Respiratory: Normal respiratory effort without tachypnea nor retractions. Breath sounds are clear and equal bilaterally. No wheezes/rales/rhonchi. Gastrointestinal: Soft. No distention, no guarding, no rebound. Nontender.    Genitourinary/rectal:Deferred Musculoskeletal: Left forearm swelling from the mid forearm into the hand.  Tenderness at the left wrist.  Neurovascularly intact.  Pelvis and hips are stable and nontender.  Right  upper extremity and lower extremity's are atraumatic. Neurologic:  Normal speech and language. No gross or focal neurologic deficits are appreciated. Skin:  Skin is warm, dry and intact. No rash noted. Psychiatric: Mood and affect are normal. Speech and behavior are normal. Patient exhibits appropriate insight and judgment.   ____________________________________________  LABS (pertinent positives/negatives) I, Governor Rooksebecca Raquon Milledge, MD the attending physician have reviewed the labs noted below.  Labs Reviewed - No data to display  ____________________________________________    EKG I, Governor Rooksebecca Damone Fancher, MD, the attending physician have personally viewed and interpreted all ECGs.  None ____________________________________________  RADIOLOGY All Xrays were viewed by me.  Imaging interpreted by Radiologist, and I, Governor Rooksebecca Caeley Dohrmann, MD the attending physician have reviewed the radiologist interpretation noted below.  CT head and C-spine without contrast:  IMPRESSION: 1. Atrophy and microvascular ischemic disease without superimposed acute intracranial process. 2. No fracture or static subluxation of the cervical spine. 3. Moderate to severe multilevel cervical spine DDD, worse at C5-C6. 4. Reversal of the expected cervical lordosis without associated anterolisthesis or retrolisthesis.  X-ray left wrist:  IMPRESSION: Intra-articular minimally displaced vertically oriented fracture through the distal left radial shaft.  Moderate degree of soft tissue swelling about the left wrist. __________________________________________  PROCEDURES  Procedure(s) performed: SPLINT APPLICATION Date/Time: 2:21 PM Authorized by: Governor Rooksebecca Olamide Lahaie Consent: Verbal consent obtained. Risks and benefits: risks, benefits and alternatives were discussed Consent given by: patient Splint applied by: orthopedic technician Location details: Left short arm volar Splint type: Left short arm volar Supplies used:  Ortho-Glass and Ace wrap Post-procedure: The splinted body part was neurovascularly unchanged following the procedure. Patient tolerance: Patient tolerated the procedure well with no immediate complications.     Critical Care performed: None   ____________________________________________  ED COURSE / ASSESSMENT AND PLAN  Pertinent labs & imaging results that were available during my care of the patient were reviewed by me and considered in my medical decision making (see chart for details).    Sister is also here, the fall was apparently mechanical, no concern for ongoing medical contributing symptoms or illnesses.  Trauma eval included head CT and cervical spine, which were negative for acute traumatic injury.  Left forearm is significantly swollen, and x-ray confirms intra-articular fracture which is minimally to nondisplaced.  Volar splint placed.    CONSULTATIONS:   Spoke with Dr. Allena KatzPatel, orthopedics, recommends splint and follow-up as an outpatient.   Patient / Family / Caregiver informed of clinical course, medical decision-making process, and agree with plan.   I discussed return precautions, follow-up instructions, and discharge instructions with patient and/or family.  Discharge Instructions : You have a left wrist fracture, keep in splint until seen in follow-up with the orthopedic surgeon in about 1  week.  Return to the emergency department immediately for any new or worsening conditions including numbness or tingling, worsening or uncontrolled pain, or any medical symptoms such as chest pain, trouble breathing, confusion altered mental status, or any other symptoms concerning to you.    ___________________________________________   FINAL CLINICAL IMPRESSION(S) / ED DIAGNOSES   Final diagnoses:  Closed fracture of distal end of left radius, unspecified fracture morphology, initial encounter      ___________________________________________  ED Discharge  Orders    None            Note: This dictation was prepared with Dragon dictation. Any transcriptional errors that result from this process are unintentional    Governor RooksLord, Harbert Fitterer, MD 03/20/17 1422

## 2017-03-20 NOTE — ED Notes (Signed)
Pt. Verbalizes understanding of d/c instructions, medications, and follow-up. VS stable.  Pt. In NAD at time of d/c and denies further concerns regarding this visit. Pt. Stable at the time of departure from the unit, departing unit by the safest and most appropriate manner per that pt condition and limitations with all belongings accounted for. Pt advised to return to the ED at any time for emergent concerns, or for new/worsening symptoms.   

## 2017-03-20 NOTE — Discharge Instructions (Signed)
You have a left wrist fracture, keep in splint until seen in follow-up with the orthopedic surgeon in about 1 week.  Return to the emergency department immediately for any new or worsening conditions including numbness or tingling, worsening or uncontrolled pain, or any medical symptoms such as chest pain, trouble breathing, confusion altered mental status, or any other symptoms concerning to you.

## 2019-11-02 ENCOUNTER — Other Ambulatory Visit: Payer: Self-pay

## 2019-11-02 ENCOUNTER — Encounter: Payer: Self-pay | Admitting: Internal Medicine

## 2019-11-02 ENCOUNTER — Inpatient Hospital Stay
Admission: AD | Admit: 2019-11-02 | Discharge: 2019-11-05 | DRG: 603 | Disposition: A | Discharge status: Skilled Nursing Facility | Payer: Medicare (Managed Care) | Attending: Internal Medicine | Admitting: Internal Medicine

## 2019-11-02 ENCOUNTER — Emergency Department: Payer: Medicare (Managed Care)

## 2019-11-02 DIAGNOSIS — R509 Fever, unspecified: Secondary | ICD-10-CM

## 2019-11-02 DIAGNOSIS — Z20822 Contact with and (suspected) exposure to covid-19: Secondary | ICD-10-CM | POA: Diagnosis present

## 2019-11-02 DIAGNOSIS — G309 Alzheimer's disease, unspecified: Secondary | ICD-10-CM | POA: Diagnosis present

## 2019-11-02 DIAGNOSIS — F03918 Unspecified dementia, unspecified severity, with other behavioral disturbance: Secondary | ICD-10-CM

## 2019-11-02 DIAGNOSIS — R52 Pain, unspecified: Secondary | ICD-10-CM

## 2019-11-02 DIAGNOSIS — I10 Essential (primary) hypertension: Secondary | ICD-10-CM

## 2019-11-02 DIAGNOSIS — F0390 Unspecified dementia without behavioral disturbance: Secondary | ICD-10-CM

## 2019-11-02 DIAGNOSIS — F05 Delirium due to known physiological condition: Secondary | ICD-10-CM

## 2019-11-02 DIAGNOSIS — L03113 Cellulitis of right upper limb: Secondary | ICD-10-CM | POA: Diagnosis not present

## 2019-11-02 DIAGNOSIS — F0281 Dementia in other diseases classified elsewhere with behavioral disturbance: Secondary | ICD-10-CM | POA: Diagnosis present

## 2019-11-02 DIAGNOSIS — F329 Major depressive disorder, single episode, unspecified: Secondary | ICD-10-CM | POA: Diagnosis present

## 2019-11-02 DIAGNOSIS — G40909 Epilepsy, unspecified, not intractable, without status epilepticus: Secondary | ICD-10-CM

## 2019-11-02 DIAGNOSIS — K219 Gastro-esophageal reflux disease without esophagitis: Secondary | ICD-10-CM | POA: Diagnosis present

## 2019-11-02 DIAGNOSIS — Z79899 Other long term (current) drug therapy: Secondary | ICD-10-CM

## 2019-11-02 DIAGNOSIS — Z87891 Personal history of nicotine dependence: Secondary | ICD-10-CM

## 2019-11-02 DIAGNOSIS — R296 Repeated falls: Secondary | ICD-10-CM

## 2019-11-02 LAB — URINALYSIS, COMPLETE (UACMP) WITH MICROSCOPIC
Bilirubin Urine: NEGATIVE
Glucose, UA: NEGATIVE mg/dL
Ketones, ur: 5 mg/dL — AB
Leukocytes,Ua: NEGATIVE
Nitrite: NEGATIVE
Protein, ur: NEGATIVE mg/dL
Specific Gravity, Urine: 1.009 (ref 1.005–1.030)
WBC, UA: NONE SEEN WBC/hpf (ref 0–5)
pH: 7 (ref 5.0–8.0)

## 2019-11-02 LAB — COMPREHENSIVE METABOLIC PANEL
ALT: 14 U/L (ref 0–44)
AST: 26 U/L (ref 15–41)
Albumin: 3.8 g/dL (ref 3.5–5.0)
Alkaline Phosphatase: 102 U/L (ref 38–126)
Anion gap: 11 (ref 5–15)
BUN: 12 mg/dL (ref 8–23)
CO2: 30 mmol/L (ref 22–32)
Calcium: 9.2 mg/dL (ref 8.9–10.3)
Chloride: 96 mmol/L — ABNORMAL LOW (ref 98–111)
Creatinine, Ser: 0.82 mg/dL (ref 0.44–1.00)
GFR calc Af Amer: >60 mL/min (ref >60)
GFR calc non Af Amer: >60 mL/min (ref >60)
Glucose, Bld: 120 mg/dL — ABNORMAL HIGH (ref 70–99)
Potassium: 3 mmol/L — ABNORMAL LOW (ref 3.5–5.1)
Sodium: 137 mmol/L (ref 135–145)
Total Bilirubin: 0.8 mg/dL (ref 0.3–1.2)
Total Protein: 9 g/dL — ABNORMAL HIGH (ref 6.5–8.1)

## 2019-11-02 LAB — CBC
HCT: 36.8 % (ref 36.0–46.0)
Hemoglobin: 11.4 g/dL — ABNORMAL LOW (ref 12.0–15.0)
MCH: 23.3 pg — ABNORMAL LOW (ref 26.0–34.0)
MCHC: 31 g/dL (ref 30.0–36.0)
MCV: 75.1 fL — ABNORMAL LOW (ref 80.0–100.0)
Platelets: 186 10*3/uL (ref 150–400)
RBC: 4.9 MIL/uL (ref 3.87–5.11)
RDW: 15.5 % (ref 11.5–15.5)
WBC: 7.8 10*3/uL (ref 4.0–10.5)
nRBC: 0 % (ref 0.0–0.2)

## 2019-11-02 LAB — LACTIC ACID, PLASMA
Lactic Acid, Venous: 1.3 mmol/L (ref 0.5–1.9)
Lactic Acid, Venous: 1 mmol/L (ref 0.5–1.9)

## 2019-11-02 MED ORDER — ENOXAPARIN SODIUM 40 MG/0.4ML ~~LOC~~ SOLN
40.0000 mg | SUBCUTANEOUS | Status: DC
  Administered 2019-11-02 – 2019-11-04 (×3): 40 mg via SUBCUTANEOUS
  Filled 2019-11-02 (×3): qty 0.4

## 2019-11-02 MED ORDER — LEVETIRACETAM 750 MG PO TABS
750.0000 mg | ORAL_TABLET | Freq: Two times a day (BID) | ORAL | Status: DC
  Administered 2019-11-02 – 2019-11-05 (×6): 750 mg via ORAL
  Filled 2019-11-02 (×9): qty 1

## 2019-11-02 MED ORDER — ACETAMINOPHEN 650 MG RE SUPP: 650.0000 mg | Freq: Four times a day (QID) | RECTAL | Status: DC | PRN

## 2019-11-02 MED ORDER — VANCOMYCIN HCL IN DEXTROSE 1-5 GM/200ML-% IV SOLN
1000.0000 mg | Freq: Once | INTRAVENOUS | Status: AC
  Administered 2019-11-02: 1000 mg via INTRAVENOUS
  Filled 2019-11-02: qty 200

## 2019-11-02 MED ORDER — SODIUM CHLORIDE 0.9 % IV SOLN
1.0000 g | Freq: Once | INTRAVENOUS | Status: AC
  Administered 2019-11-02: 1 g via INTRAVENOUS
  Filled 2019-11-02: qty 10

## 2019-11-02 MED ORDER — ONDANSETRON HCL 4 MG PO TABS: 4.0000 mg | ORAL_TABLET | Freq: Four times a day (QID) | ORAL | Status: DC | PRN

## 2019-11-02 MED ORDER — POTASSIUM CHLORIDE CRYS ER 20 MEQ PO TBCR
40.0000 meq | EXTENDED_RELEASE_TABLET | Freq: Once | ORAL | Status: AC
  Administered 2019-11-02: 40 meq via ORAL
  Filled 2019-11-02: qty 2

## 2019-11-02 MED ORDER — ACETAMINOPHEN 325 MG PO TABS
650.0000 mg | ORAL_TABLET | Freq: Once | ORAL | Status: AC
  Administered 2019-11-02: 650 mg via ORAL
  Filled 2019-11-02: qty 2

## 2019-11-02 MED ORDER — LORAZEPAM 2 MG/ML IJ SOLN
1.0000 mg | Freq: Once | INTRAMUSCULAR | Status: AC
  Administered 2019-11-02: 1 mg via INTRAVENOUS
  Filled 2019-11-02: qty 1

## 2019-11-02 MED ORDER — ONDANSETRON HCL 4 MG/2ML IJ SOLN: 4.0000 mg | Freq: Four times a day (QID) | INTRAMUSCULAR | Status: DC | PRN

## 2019-11-02 MED ORDER — LABETALOL HCL 5 MG/ML IV SOLN
10.0000 mg | INTRAVENOUS | Status: DC | PRN
  Administered 2019-11-02: 10 mg via INTRAVENOUS
  Filled 2019-11-02: qty 4

## 2019-11-02 MED ORDER — ACETAMINOPHEN 325 MG PO TABS
650.0000 mg | ORAL_TABLET | Freq: Four times a day (QID) | ORAL | Status: DC | PRN
  Administered 2019-11-03 – 2019-11-04 (×3): 650 mg via ORAL
  Filled 2019-11-02 (×3): qty 2

## 2019-11-02 MED ORDER — SODIUM CHLORIDE 0.9 % IV SOLN
1.0000 g | INTRAVENOUS | Status: DC
  Administered 2019-11-03 – 2019-11-04 (×2): 1 g via INTRAVENOUS
  Filled 2019-11-02: qty 10
  Filled 2019-11-02 (×2): qty 1

## 2019-11-02 MED ORDER — PHENYTOIN SODIUM EXTENDED 100 MG PO CAPS
200.0000 mg | ORAL_CAPSULE | Freq: Every day | ORAL | Status: DC
  Administered 2019-11-02 – 2019-11-04 (×3): 200 mg via ORAL
  Filled 2019-11-02 (×5): qty 2

## 2019-11-02 NOTE — ED Notes (Signed)
1st set of blood cultures sent, pt's sister here to be with her

## 2019-11-02 NOTE — H&P (Signed)
History and Physical    [place] UXL:[id number] DOB: [birth year] DOA: 11/02/2019  PCP: System, Pcp Not In   [facility] [place] [facility]  I have personally briefly reviewed patient's old medical records in [facility]  Chief Complaint: Confusion, right arm pain  HPI: Tammy Clayton is a 86 y.o. female with medical history significant for hypertension, seizure disorder, Alzheimer's dementia, and history of frequent falls who was sent to the emergency room for evaluation of a 3-day history of pain in the right arm and agitation beyond her baseline for dementia.  She has had no fever or chills, no reports of vomiting or diarrhea or abdominal pain.  No recent falls.  Denies cough, chest pain and shortness of breath.  She was in her usual state of health until she developed right forearm pain about 3 days prior.  Due to history of dementia most of history taken from emergency room records and reports from [facility] ED Course: On arrival she was febrile at 101.4, heart rate 85, BP 184/85 O2 sat normal on room air.  Blood work was for the most part unremarkable except for a low potassium of 3.  Urinalysis was mostly normal.  Chest x-ray showed no acute disease.  She had x-rays of her right shoulder elbow and wrist with no evidence of acute fracture or dislocation but she did have some swelling of the dorsal aspect of the right forearm.  Patient started on Rocephin in the ER for cellulitis of the right arm.  Hospitalist consulted for admission.  Review of Systems: Unable to obtain due to dementia and confusion   Past Medical History:  Diagnosis Date  . Anemia   . Dementia   . Depression   . GERD (gastroesophageal reflux disease)   . Hypertension   . Leukopenia    chronic  . Memory change   . Palpitations   . Respiratory difficulty    h/o failure  . Seizures (HCC)     Past Surgical History:  Procedure Laterality Date  . CATARACT EXTRACTION W/PHACO Right  02/04/2015   Procedure: CATARACT EXTRACTION PHACO AND INTRAOCULAR LENS PLACEMENT (IOC);  Surgeon: Galen Manila, MD;  Location: [facility] ORS;  Service: Ophthalmology;  Laterality: Right;  [place] 1:16 ap 49.1 cde 19.71 fluid lot # [id number]  . CATARACT EXTRACTION W/PHACO Left 04/29/2015   Procedure: CATARACT EXTRACTION PHACO AND INTRAOCULAR LENS PLACEMENT (IOC);  Surgeon: Galen Manila, MD;  Location: [facility] ORS;  Service: Ophthalmology;  Laterality: Left;  [place] 00:53 AP% 21.6 CDE 11.51 fluid pack lot # [id number]  . EYE SURGERY       reports that she has quit smoking. She does not have any smokeless tobacco history on file. She reports that she does not drink alcohol and does not use drugs.  No Known Allergies  Family history: Unable to obtain due to patient's dementia   Prior to Admission medications   Medication Sig Start Date End Date Taking? Authorizing Provider  acetaminophen (TYLENOL) 325 MG tablet Take 650 mg by mouth every 6 (six) hours as needed for mild pain or moderate pain.   Yes [provider]  citalopram (CELEXA) 20 MG tablet Take 20 mg by mouth daily.   Yes [provider]  diclofenac Sodium (VOLTAREN) 1 % GEL Apply 2 g topically 3 (three) times daily as needed (wrist pain).   Yes [provider]  levETIRAcetam (KEPPRA) 750 MG tablet Take 750 mg by mouth 2 (two) times daily.   Yes  [provider]  lisinopril (ZESTRIL) 20 MG tablet Take 20 mg by mouth daily.   Yes [provider]  omeprazole (PRILOSEC) 40 MG capsule Take 40 mg by mouth daily.   Yes [provider]  phenytoin (DILANTIN) 100 MG ER capsule Take 200 mg by mouth at bedtime.    Yes [provider]  polyethylene glycol (MIRALAX / GLYCOLAX) 17 g packet Take 17 g by mouth daily as needed for mild constipation or moderate constipation.   Yes [provider]  traZODone (DESYREL) 50 MG tablet Take 50 mg by mouth at bedtime.   Yes [provider]     Physical Exam: Vitals:   11/02/19 1617 11/02/19 1800 11/02/19 1832 11/02/19 1922  BP:    (!) 190/92  Pulse:    86  Resp:  16  18  Temp:   (!) 100.8 F (38.2 C)   TempSrc:   Oral   SpO2:    96%  Weight: 63.5 kg     Height: 5\' 2"  (1.575 m)        Vitals:   11/02/19 1617 11/02/19 1800 11/02/19 1832 11/02/19 1922  BP:    (!) 190/92  Pulse:    86  Resp:  16  18  Temp:   (!) 100.8 F (38.2 C)   TempSrc:   Oral   SpO2:    96%  Weight: 63.5 kg     Height: 5\' 2"  (1.575 m)         Constitutional: Alert, but very confused and restless and oriented x 1 . Not in any apparent distress HEENT:      Head: Normocephalic and atraumatic.         Eyes: PERLA, EOMI, Conjunctivae are normal. Sclera is non-icteric.       Mouth/Throat: Mucous membranes are moist.       Neck: Supple with no signs of meningismus. Cardiovascular: Regular rate and rhythm. No murmurs, gallops, or rubs. 2+ symmetrical distal pulses are present . No JVD. No LE edema Respiratory: Respiratory effort normal .Lungs sounds clear bilaterally. No wheezes, crackles, or rhonchi.  Gastrointestinal: Soft, non tender, and non distended with positive bowel sounds. No rebound or guarding. Genitourinary: No CVA tenderness. Musculoskeletal:  Pain with range of motion of right shoulder and right wrist.  Swelling dorsum of right hand but without warmth. Neurologic: Normal speech and language. Face is symmetric. Moving all extremities. No gross focal neurologic deficits . Skin: Skin is warm, dry.  No rash or ulcers Psychiatric: Somewhat restless.  Difficult to assess due to confusion   Labs on Admission: I have personally reviewed following labs and imaging studies  CBC: Recent Labs  Lab 11/02/19 1628  WBC 7.8  HGB 11.4*  HCT 36.8  MCV 75.1*  PLT 186   Basic Metabolic Panel: Recent Labs  Lab 11/02/19 1628  NA 137  K 3.0*  CL 96*  CO2 30  GLUCOSE 120*  BUN 12  CREATININE 0.82  CALCIUM 9.2   GFR: Estimated  Creatinine Clearance: 43.9 mL/min (by C-G formula based on SCr of 0.82 mg/dL). Liver Function Tests: Recent Labs  Lab 11/02/19 1628  AST 26  ALT 14  ALKPHOS 102  BILITOT 0.8  PROT 9.0*  ALBUMIN 3.8   No results for input(s): LIPASE, AMYLASE in the last 168 hours. No results for input(s): AMMONIA in the last 168 hours. Coagulation Profile: No results for input(s): INR, PROTIME in the last 168 hours. Cardiac Enzymes: No results for input(s):  CKTOTAL, CKMB, CKMBINDEX, TROPONINI in the last 168 hours. BNP (last 3 results) No results for input(s): PROBNP in the last 8760 hours. HbA1C: No results for input(s): HGBA1C in the last 72 hours. CBG: No results for input(s): GLUCAP in the last 168 hours. Lipid Profile: No results for input(s): CHOL, HDL, LDLCALC, TRIG, CHOLHDL, LDLDIRECT in the last 72 hours. Thyroid Function Tests: No results for input(s): TSH, T4TOTAL, FREET4, T3FREE, THYROIDAB in the last 72 hours. Anemia Panel: No results for input(s): VITAMINB12, FOLATE, FERRITIN, TIBC, IRON, RETICCTPCT in the last 72 hours. Urine analysis:    Component Value Date/Time   COLORURINE YELLOW (A) 11/02/2019 1910   APPEARANCEUR CLEAR (A) 11/02/2019 1910   APPEARANCEUR Cloudy 06/05/2011 1321   LABSPEC 1.009 11/02/2019 1910   LABSPEC 1.016 06/05/2011 1321   PHURINE 7.0 11/02/2019 1910   GLUCOSEU NEGATIVE 11/02/2019 1910   GLUCOSEU Negative 06/05/2011 1321   HGBUR SMALL (A) 11/02/2019 1910   BILIRUBINUR NEGATIVE 11/02/2019 1910   BILIRUBINUR Negative 06/05/2011 1321   KETONESUR 5 (A) 11/02/2019 1910   PROTEINUR NEGATIVE 11/02/2019 1910   NITRITE NEGATIVE 11/02/2019 1910   LEUKOCYTESUR NEGATIVE 11/02/2019 1910   LEUKOCYTESUR 2+ 06/05/2011 1321    Radiological Exams on Admission: DG Chest 1 View  Result Date: 11/02/2019 CLINICAL DATA:  History of multiple falls. EXAM: CHEST  1 VIEW COMPARISON:  June 05, 2011 FINDINGS: There is no evidence of acute infiltrate, pleural effusion  or pneumothorax. The heart size and mediastinal contours are within normal limits. An intact left shoulder replacement is noted. No acute osseous abnormalities are seen. IMPRESSION: No active disease. Electronically Signed   By: Aram Candela M.D.   On: 11/02/2019 18:52   DG Shoulder Right  Result Date: 11/02/2019 CLINICAL DATA:  History of multiple falls. EXAM: RIGHT SHOULDER - 2+ VIEW COMPARISON:  None. FINDINGS: There is no evidence of an acute fracture or dislocation. A chronic seventh right rib deformity is seen. Moderate severity chronic and degenerative changes seen involving the right acromioclavicular joint and right glenohumeral articulation. Soft tissues are unremarkable. IMPRESSION: Chronic and degenerative changes without evidence of an acute fracture or dislocation. Electronically Signed   By: Aram Candela M.D.   On: 11/02/2019 18:50   DG Elbow 2 Views Right  Result Date: 11/02/2019 CLINICAL DATA:  Status post trauma. EXAM: RIGHT ELBOW - 2 VIEW COMPARISON:  None. FINDINGS: There is no evidence of fracture, dislocation, or joint effusion. There is no evidence of arthropathy or other focal bone abnormality. There is mild dorsal soft tissue swelling. IMPRESSION: Mild dorsal soft tissue swelling without evidence of an acute fracture or dislocation. Electronically Signed   By: Aram Candela M.D.   On: 11/02/2019 18:51   DG Wrist Complete Right  Result Date: 11/02/2019 CLINICAL DATA:  History of multiple falls. EXAM: RIGHT WRIST - COMPLETE 3+ VIEW COMPARISON:  None. FINDINGS: There is no evidence of an acute fracture or dislocation. Marked severity degenerative changes are seen involving the carpometacarpal articulation of the left thumb. Mild diffuse degenerative changes are also seen throughout the right wrist. Soft tissues are unremarkable. IMPRESSION: Advanced degenerative changes at the carpometacarpal articulation of the left thumb. Electronically Signed   By: Aram Candela  M.D.   On: 11/02/2019 18:49    EKG: Independently reviewed. Interpretation :   Assessment/Plan  86 year old female with history of hypertension and dementia presenting with 3-day history of right wrist pain associated with increased agitation    ?Cellulitis of right hand -Patient with noted  swelling of the dorsum of the right hand, though without redness or significant tenderness, with temperature of 101 on arrival.  Normal WBC no other source of infection.  Chest x-ray with no acute disease and urinalysis unremarkable -X-rays of right shoulder elbow and forearm showing no acute injury -Was started on ceftriaxone and vancomycin in the ER -Continue ceftriaxone for mild to moderate cellulitis -Ice packs as needed -Follow blood cultures    Acute confusion suspect due to infection, superimposed on Dementia with behavioral disturbance (HCC) -Treat infection as above -Haldol as needed agitation if not improving with redirection -Neurologic checks  HTN (hypertension)  -Continue home medication, lisinopril pending med rec    Seizure disorder ([place]) -Continue home Keppra and Dilantin pending med rec    Frequent falls -Fall precautions    DVT prophylaxis: Lovenox  Code Status: full code  Family Communication:  none  Disposition Plan: Back to previous home environment Consults called: none  Status:obs    Athena Masse MD [facility]     11/02/2019, 7:44 PM

## 2019-11-02 NOTE — ED Notes (Signed)
X-ray at bedside

## 2019-11-02 NOTE — ED Triage Notes (Signed)
First RN note: Pt presents to ED via bus from senior care, per packet from senior care patient brought to ED after fall f/u with increased agitation and pt has had difficulty moving R arm, per family pt with decreased ROM to R arm since before waking up this morning. Pt with hx Alzheimers and is confused at this time. Pt noted with decreased motion to R arm.

## 2019-11-02 NOTE — ED Notes (Signed)
Attempted to obtain second set of blood cultures. Unsuccessful.

## 2019-11-02 NOTE — ED Provider Notes (Signed)
[facility] Emergency Department Provider Note   ____________________________________________   First MD Initiated Contact with Patient 11/02/19 1756     (approximate)  I have reviewed the triage vital signs and the nursing notes.   HISTORY  Chief Complaint Arm Pain    HPI Tammy Clayton is a 86 y.o. female with past medical history of Alzheimer's dementia, hypertension, and seizures who presents to the ED complaining of arm pain.  History is limited due to patient's baseline dementia.  Per her sister, she has complained of increasing pain extending from her right shoulder to her right wrist over the past 3 days.  She has a long history of falls, but has not had any recent witnessed falls.  Patient comes from the [facility] center, where they were also concerned that she has been not acting like her usual self.  She has reportedly been more agitated than usual and had a temperature of 99 at pace earlier today.  Patient and sister deny any recent fevers.  Patient currently denies any cough, chest pain, shortness of breath, abdominal pain, vomiting, diarrhea, dysuria, or hematuria.        Past Medical History:  Diagnosis Date  . Anemia   . Dementia   . Depression   . GERD (gastroesophageal reflux disease)   . Hypertension   . Leukopenia    chronic  . Memory change   . Palpitations   . Respiratory difficulty    h/o failure  . Seizures [facility])     Patient Active Problem List   Diagnosis Date Noted  . Cellulitis of right arm 11/02/2019  . Acute confusion due to infection 11/02/2019  . Dementia with behavioral disturbance (HCC) 11/02/2019  . HTN (hypertension) 11/02/2019  . Seizure disorder (HCC) 11/02/2019  . Frequent falls 11/02/2019    Past Surgical History:  Procedure Laterality Date  . CATARACT EXTRACTION W/PHACO Right 02/04/2015   Procedure: CATARACT EXTRACTION PHACO AND INTRAOCULAR LENS PLACEMENT (IOC);  Surgeon: Galen Manila, MD;  Location: [facility] ORS;  Service: Ophthalmology;  Laterality: Right;  [place] 1:16 ap 49.1 cde 19.71 fluid lot # [id number]  . CATARACT EXTRACTION W/PHACO Left 04/29/2015   Procedure: CATARACT EXTRACTION PHACO AND INTRAOCULAR LENS PLACEMENT (IOC);  Surgeon: Galen Manila, MD;  Location: [facility] ORS;  Service: Ophthalmology;  Laterality: Left;  [place] 00:53 AP% 21.6 CDE 11.51 fluid pack lot # [id number]  . EYE SURGERY      Prior to Admission medications   Medication Sig Start Date End Date Taking? Authorizing Provider  acetaminophen (TYLENOL) 325 MG tablet Take 650 mg by mouth every 6 (six) hours as needed for mild pain or moderate pain.   Yes [provider]  citalopram (CELEXA) 20 MG tablet Take 20 mg by mouth daily.   Yes [provider]  diclofenac Sodium (VOLTAREN) 1 % GEL Apply 2 g topically 3 (three) times daily as needed (wrist pain).   Yes [provider]  levETIRAcetam (KEPPRA) 750 MG tablet Take 750 mg by mouth 2 (two) times daily.   Yes [provider]  lisinopril (ZESTRIL) 20 MG tablet Take 20 mg by mouth daily.   Yes [provider]  omeprazole (PRILOSEC) 40 MG capsule Take 40 mg by mouth daily.   Yes [provider]  phenytoin (DILANTIN) 100 MG ER capsule Take 200 mg by mouth at bedtime.    Yes [provider]  polyethylene glycol (MIRALAX / GLYCOLAX) 17 g packet Take 17 g by mouth  daily as needed for mild constipation or moderate constipation.   Yes [provider]  traZODone (DESYREL) 50 MG tablet Take 50 mg by mouth at bedtime.   Yes [provider]    Allergies Patient has no known allergies.  No family history on file.  Social History Social History   Tobacco Use  . Smoking status: Former Smoker  Substance Use Topics  . Alcohol use: No  . Drug use: No    Review of Systems  Constitutional: No fever/chills Eyes: No visual changes. ENT: No sore throat. Cardiovascular: Denies  chest pain. Respiratory: Denies shortness of breath. Gastrointestinal: No abdominal pain.  No nausea, no vomiting.  No diarrhea.  No constipation. Genitourinary: Negative for dysuria. Musculoskeletal: Negative for back pain.  Positive for right arm pain. Skin: Negative for rash. Neurological: Negative for headaches, focal weakness or numbness.  ____________________________________________   PHYSICAL EXAM:  VITAL SIGNS: ED Triage Vitals  Enc Vitals Group     BP 11/02/19 1615 (!) 184/85     Pulse Rate 11/02/19 1615 85     Resp 11/02/19 1615 16     Temp 11/02/19 1615 (!) 101.4 F (38.6 C)     Temp Source 11/02/19 1615 Oral     SpO2 11/02/19 1615 96 %     Weight 11/02/19 1617 140 lb (63.5 kg)     Height 11/02/19 1617 5\' 2"  (1.575 m)     Head Circumference --      Peak Flow --      Pain Score --      Pain Loc --      Pain Edu? --      Excl. in GC? --     Constitutional: Awake and alert. Eyes: Conjunctivae are normal. Head: Atraumatic. Nose: No congestion/rhinnorhea. Mouth/Throat: Mucous membranes are moist. Neck: Normal ROM Cardiovascular: Normal rate, regular rhythm. Grossly normal heart sounds.  2+ radial pulses bilaterally. Respiratory: Normal respiratory effort.  No retractions. Lungs CTAB. Gastrointestinal: Soft and nontender. No distention. Genitourinary: deferred Musculoskeletal: No lower extremity tenderness nor edema.  Edema noted to distal right arm and hand extending to her mid forearm.  Associated erythema and warmth noted with associated area tender to palpation.  Able to range right shoulder and elbow with minimal discomfort, moderate discomfort when ranging right wrist. Neurologic:  Normal speech and language. No gross focal neurologic deficits are appreciated. Skin:  Skin is warm, dry and intact. No rash noted. Psychiatric: Mood and affect are normal. Speech and behavior are normal.  ____________________________________________   LABS (all labs ordered  are listed, but only abnormal results are displayed)  Labs Reviewed  CBC - Abnormal; Notable for the following components:      Result Value   Hemoglobin 11.4 (*)    MCV 75.1 (*)    MCH 23.3 (*)    All other components within normal limits  COMPREHENSIVE METABOLIC PANEL - Abnormal; Notable for the following components:   Potassium 3.0 (*)    Chloride 96 (*)    Glucose, Bld 120 (*)    Total Protein 9.0 (*)    All other components within normal limits  CULTURE, BLOOD (ROUTINE X 2)  CULTURE, BLOOD (ROUTINE X 2)  SARS CORONAVIRUS 2 (TAT 6-24 HRS)  LACTIC ACID, PLASMA  LACTIC ACID, PLASMA  URINALYSIS, COMPLETE (UACMP) WITH MICROSCOPIC    PROCEDURES  Procedure(s) performed (including Critical Care):  Procedures   ____________________________________________   INITIAL IMPRESSION / ASSESSMENT AND PLAN / ED COURSE  86 year old female with past medical history of dementia, hypertension, and seizures presents to the ED complaining of 3 days of worsening right arm pain.  She is noted to have a fever on upon arrival to the ED but remainder of vital signs are not consistent with sepsis.  She does have what appears to be a cellulitis to her right hand and distal forearm with no purulent drainage or skin lesions.  This is likely the source of her fever, but we will also screen chest x-ray and UA.  She is able to range all the joints in her right upper extremity and I do not suspect septic arthritis.  Given her history of recent falls, we will check x-ray of right shoulder, elbow, and wrist.  DVT seems unlikely in her upper extremity and she has no apparent risk factors for VTE.  Lab work thus far is reassuring, no leukocytosis or electrolyte abnormality.  X-rays are negative for acute process, no apparent bony injury.  We will start patient on Rocephin and vancomycin for apparent cellulitis.  Case discussed with hospitalist for admission.      ____________________________________________   FINAL CLINICAL IMPRESSION(S) / ED DIAGNOSES  Final diagnoses:  Right arm cellulitis  Fever, unspecified fever cause     ED Discharge Orders    None       Note:  This document was prepared using Dragon voice recognition software and may include unintentional dictation errors.   Blake Divine, MD 11/02/19 [id number]

## 2019-11-02 NOTE — ED Triage Notes (Addendum)
Pt brought from [facility], pt's sister is on her way, pt has a hx of dementia, pt has a hx of multiple falls with a recent xray of her right wrist, showing arthritis, pt unwilling to move right arm independently and sent for further eval due to concern with the arm and pt shaking and seeming to be more agitated than usual, low grade temp of 99 at pace

## 2019-11-02 NOTE — ED Notes (Signed)
Pt's BP is elevated above normal limits at this time. NP Ouma notified for HTN protocols. Pt denies taking any BP meds today. Orders to follow

## 2019-11-03 DIAGNOSIS — I10 Essential (primary) hypertension: Secondary | ICD-10-CM | POA: Diagnosis present

## 2019-11-03 DIAGNOSIS — F329 Major depressive disorder, single episode, unspecified: Secondary | ICD-10-CM | POA: Diagnosis present

## 2019-11-03 DIAGNOSIS — G40909 Epilepsy, unspecified, not intractable, without status epilepticus: Secondary | ICD-10-CM

## 2019-11-03 DIAGNOSIS — G309 Alzheimer's disease, unspecified: Secondary | ICD-10-CM | POA: Diagnosis present

## 2019-11-03 DIAGNOSIS — Z20822 Contact with and (suspected) exposure to covid-19: Secondary | ICD-10-CM | POA: Diagnosis present

## 2019-11-03 DIAGNOSIS — F05 Delirium due to known physiological condition: Secondary | ICD-10-CM

## 2019-11-03 DIAGNOSIS — F0281 Dementia in other diseases classified elsewhere with behavioral disturbance: Secondary | ICD-10-CM | POA: Diagnosis present

## 2019-11-03 DIAGNOSIS — F0391 Unspecified dementia with behavioral disturbance: Secondary | ICD-10-CM | POA: Diagnosis not present

## 2019-11-03 DIAGNOSIS — R52 Pain, unspecified: Secondary | ICD-10-CM | POA: Diagnosis present

## 2019-11-03 DIAGNOSIS — Z79899 Other long term (current) drug therapy: Secondary | ICD-10-CM | POA: Diagnosis not present

## 2019-11-03 DIAGNOSIS — L03113 Cellulitis of right upper limb: Secondary | ICD-10-CM | POA: Diagnosis present

## 2019-11-03 DIAGNOSIS — Z87891 Personal history of nicotine dependence: Secondary | ICD-10-CM | POA: Diagnosis not present

## 2019-11-03 DIAGNOSIS — K219 Gastro-esophageal reflux disease without esophagitis: Secondary | ICD-10-CM | POA: Diagnosis present

## 2019-11-03 DIAGNOSIS — R296 Repeated falls: Secondary | ICD-10-CM | POA: Diagnosis present

## 2019-11-03 LAB — BASIC METABOLIC PANEL
Anion gap: 12 (ref 5–15)
BUN: 9 mg/dL (ref 8–23)
CO2: 27 mmol/L (ref 22–32)
Calcium: 9.1 mg/dL (ref 8.9–10.3)
Chloride: 98 mmol/L (ref 98–111)
Creatinine, Ser: 0.62 mg/dL (ref 0.44–1.00)
GFR calc Af Amer: >60 mL/min (ref >60)
GFR calc non Af Amer: >60 mL/min (ref >60)
Glucose, Bld: 151 mg/dL — ABNORMAL HIGH (ref 70–99)
Potassium: 3 mmol/L — ABNORMAL LOW (ref 3.5–5.1)
Sodium: 137 mmol/L (ref 135–145)

## 2019-11-03 LAB — SARS CORONAVIRUS 2 (TAT 6-24 HRS): SARS Coronavirus 2: NEGATIVE

## 2019-11-03 LAB — POTASSIUM: Potassium: 3.2 mmol/L — ABNORMAL LOW (ref 3.5–5.1)

## 2019-11-03 MED ORDER — PANTOPRAZOLE SODIUM 40 MG PO TBEC
40.0000 mg | DELAYED_RELEASE_TABLET | Freq: Every day | ORAL | Status: DC
  Administered 2019-11-04 – 2019-11-05 (×2): 40 mg via ORAL
  Filled 2019-11-03 (×2): qty 1

## 2019-11-03 MED ORDER — POLYETHYLENE GLYCOL 3350 17 G PO PACK: 17.0000 g | PACK | Freq: Every day | ORAL | Status: DC | PRN

## 2019-11-03 MED ORDER — TRAZODONE HCL 50 MG PO TABS
50.0000 mg | ORAL_TABLET | Freq: Every day | ORAL | Status: DC
  Administered 2019-11-03 – 2019-11-04 (×2): 50 mg via ORAL
  Filled 2019-11-03 (×2): qty 1

## 2019-11-03 MED ORDER — POTASSIUM CHLORIDE 10 MEQ/100ML IV SOLN
10.0000 meq | INTRAVENOUS | Status: AC
  Administered 2019-11-03 (×4): 10 meq via INTRAVENOUS
  Filled 2019-11-03 (×3): qty 100

## 2019-11-03 MED ORDER — LISINOPRIL 20 MG PO TABS
20.0000 mg | ORAL_TABLET | Freq: Two times a day (BID) | ORAL | Status: DC
  Administered 2019-11-03 – 2019-11-05 (×4): 20 mg via ORAL
  Filled 2019-11-03 (×4): qty 1

## 2019-11-03 MED ORDER — LISINOPRIL 20 MG PO TABS
20.0000 mg | ORAL_TABLET | Freq: Every day | ORAL | Status: DC
  Administered 2019-11-03: 20 mg via ORAL
  Filled 2019-11-03: qty 1

## 2019-11-03 MED ORDER — CITALOPRAM HYDROBROMIDE 20 MG PO TABS
20.0000 mg | ORAL_TABLET | Freq: Every day | ORAL | Status: DC
  Administered 2019-11-03 – 2019-11-05 (×3): 20 mg via ORAL
  Filled 2019-11-03 (×3): qty 1

## 2019-11-03 NOTE — Progress Notes (Signed)
Returned Durel Salts NP with [facility], phone call [phone]. Per Anna Genre she was needing an update if the MD had put in discharge orders and what medications were needed to be picked up. Per Dr. Marylu Lund, plan is to discharge patient pending PT evaluation first.   Madie Reno, RN

## 2019-11-03 NOTE — NC FL2 (Signed)
[place] LEVEL OF CARE SCREENING TOOL     IDENTIFICATION  Patient Name: Chrystie Hagwood Birthdate: [birth year] Sex: female Admission Date (Current Location): 11/02/2019  [place] and [place] Number:  Engineering geologist and Address:  [facility], [address], [place], [place] [address]      Provider Number:    Attending Physician Name and Address:  Nolberto Hanlon, MD  Relative Name and Phone Number:  Bari Edward (Sister) [phone]    Current Level of Care: Hospital Recommended Level of Care: [place] Prior Approval Number:    Date Approved/Denied:   PASRR Number:    Discharge Plan: SNF    Current Diagnoses: Patient Active Problem List   Diagnosis Date Noted  . Cellulitis of right arm 11/02/2019  . Acute confusion due to infection 11/02/2019  . Dementia with behavioral disturbance ([place]) 11/02/2019  . HTN (hypertension) 11/02/2019  . Seizure disorder ([place]) 11/02/2019  . Frequent falls 11/02/2019    Orientation RESPIRATION BLADDER Height & Weight     Self    Incontinent Weight: 140 lb (63.5 kg) Height:  5\' 2"  (157.5 cm)  BEHAVIORAL SYMPTOMS/MOOD NEUROLOGICAL BOWEL NUTRITION STATUS      Incontinent    AMBULATORY STATUS COMMUNICATION OF NEEDS Skin   Extensive Assist Verbally                         Personal Care Assistance Level of Assistance  Bathing, Feeding, Dressing, Total care Bathing Assistance: Maximum assistance Feeding assistance: Limited assistance Dressing Assistance: Maximum assistance Total Care Assistance: Maximum assistance   Functional Limitations Info             SPECIAL CARE FACTORS FREQUENCY  PT (By licensed PT), OT (By licensed OT)     PT Frequency: 5 x weekly OT Frequency: 5 x weekly            Contractures Contractures Info: Not present    Additional Factors Info  Code Status Code Status Info: Full             Current Medications (11/03/2019):   This is the current hospital active medication list Current Facility-Administered Medications  Medication Dose Route Frequency Provider Last Rate Last Admin  . acetaminophen (TYLENOL) tablet 650 mg  650 mg Oral Q6H PRN Athena Masse, MD       Or  . acetaminophen (TYLENOL) suppository 650 mg  650 mg Rectal Q6H PRN Athena Masse, MD      . cefTRIAXone (ROCEPHIN) 1 g in sodium chloride 0.9 % 100 mL IVPB  1 g Intravenous Q24H Judd Gaudier V, MD      . enoxaparin (LOVENOX) injection 40 mg  40 mg Subcutaneous Q24H Athena Masse, MD   40 mg at 11/02/19 2313  . levETIRAcetam (KEPPRA) tablet 750 mg  750 mg Oral BID Athena Masse, MD   750 mg at 11/03/19 1249  . lisinopril (ZESTRIL) tablet 20 mg  20 mg Oral Daily Nolberto Hanlon, MD   20 mg at 11/03/19 1249  . ondansetron (ZOFRAN) tablet 4 mg  4 mg Oral Q6H PRN Athena Masse, MD       Or  . ondansetron [facility]) injection 4 mg  4 mg Intravenous Q6H PRN Athena Masse, MD      . phenytoin (DILANTIN) ER capsule 200 mg  200 mg Oral QHS Athena Masse, MD   200 mg at 11/02/19 2354  Discharge Medications: Please see discharge summary for a list of discharge medications.  Relevant Imaging Results:  Relevant Lab Results:   Additional Information SS# [id number]  Eilleen Kempf, LCSW

## 2019-11-03 NOTE — Evaluation (Addendum)
Physical Therapy Evaluation Patient Details Name: Tammy Clayton MRN: [id number] DOB: 12-08-1932 Today's Date: 11/03/2019   History of Present Illness  Tammy Clayton is a  86 year old female with past medical history of dementia, hypertension, and seizures presents to the ED complaining of 3 days of worsening right arm pain.  She is noted to have a fever on upon arrival to the ED but remainder of vital signs are not consistent with sepsis.  She does have what appears to be a cellulitis to her right hand and distal forearm with no purulent drainage or skin lesions. She has a long history of falls, but has not had any recent witnessed falls.  Patient comes from the [facility] center, where they were also concerned that she has been not acting like her usual self.  She has reportedly been more agitated than usual and had a temperature of 99 at pace. Admitted for R arm cellulitis and fever with unspecified cause.    Clinical Impression  Patient oriented to self only and known poor historian with advanced dementia. Sister/caregiver Tammy Clayton at bedside and provided thorough history. Patient lives with Tammy Clayton who is with her at all times except when she is at [facility] day program each day. Prior to hospitalization, patient ambulated independently household distances without an AD and no falls in the last 6 months until the past week when she has fallen 4 times for no apparent reason. She also notes pt has demonstrated a sharp decline in cognition over the last week leading up to hospitalization. Tammy Clayton states although she can provide supervision at all times when patient is at home, she is unable to provide physical assist due to her own medical limitations. Patient lives in a one story home with one step to enter, a tub/shower unit with a shower chair, toilet riser, and tub grab bars. Pt required assistance with ADLs and IADLs. Upon evaluation, patient required supervision to max A for bed mobility,  CGA- min A for transfers, and min A for ambulation ~ 60 feet. Patient demo difficulty following commands and difficulty with problem solving, motor sequencing second to cognitive impairment. She was very unsteady on feet and was unable to maintain her balance reliably while standing with RW, losing balance several times backwards and requiring min A from PT to prevent fall. Patient also required min A to negotiate room with RW and turn safely while ambulation very very slowly with shuffling feet and frequent need of cuing to continue or re-orient pt to task. Patient appears to have experienced a significant decline in functional mobility and independence and would benefit from short term rehab prior to returning home. Patient would benefit from skilled physical therapy to address impairments and functional limitations (see PT Problem List below) to work towards stated goals and return to PLOF or maximal functional independence.       Follow Up Recommendations SNF;Supervision/Assistance - 24 hour;Supervision for mobility/OOB    Equipment Recommendations  Rolling walker with 5" wheels;3in1 (PT)    Recommendations for Other Services OT consult     Precautions / Restrictions Precautions Precautions: Fall Restrictions Weight Bearing Restrictions: No      Mobility  Bed Mobility Overal bed mobility: Needs Assistance Bed Mobility: Supine to Sit;Sit to Supine     Supine to sit: Modified independent (Device/Increase time);HOB elevated Sit to supine: Max assist   General bed mobility comments: Patient able to move supine to side of bed with significant cuing. Was unable to move from sitting to supine  after prolonged time and cuing provided. Seemed unable to sequence properly and eventually tried to lay straight back. Required maxA with support at B LE and trunk to move to lying. Also required max A to scoot up in bed.  Transfers Overall transfer level: Needs assistance Equipment used: 1 person  hand held assist;Rolling walker (2 wheeled) Transfers: Sit to/from Stand Sit to Stand: Min guard;Min assist         General transfer comment: Patient was able to perform sit <> stand from edge of bed with CGA for safety. Patient had difficulty initiating movement and following commands but was strong enough to complete without physical assistance. Unsteady upon stand and required min A to prevent falling backwards.  Ambulation/Gait Ambulation/Gait assistance: Min assist Gait Distance (Feet): 60 Feet (60+20) Assistive device: Rolling walker (2 wheeled) Gait Pattern/deviations: Decreased step length - left;Decreased step length - right;Decreased stride length;Leaning posteriorly;Shuffle Gait velocity: extremely slow   General Gait Details: Pateint ambulated ~ 20 feet with RW in room, then repeated ambulation out of room ~ 60 feet. Pateint very unsteady with increased sway and several instances of falling backwards into PT who provided min A to prevent falling. Patient had difficulty with problem solving and handling of RW, getting the wheel hooked on objects in the room and requiring min A to re-orient walker. Also walked outside of walker perimeter when attempting to turn. Gait painfully slow with very short shuffle and stopping every few steps. Required max cuing and significant time to travel short distance. Attempted ambulation with no AD due to pt's cognitive difficulty using RW but she reached for any object in proximity to steady herself and was clearly not stable to safely ambulate with no AD.  Stairs            Wheelchair Mobility    Modified Rankin (Stroke Patients Only)       Balance Overall balance assessment: Needs assistance Sitting-balance support: Feet supported;No upper extremity supported Sitting balance-Leahy Scale: Fair Sitting balance - Comments: steady edge of bed sitting unless she becomes confused or drowsy   Standing balance support: Bilateral upper  extremity supported Standing balance-Leahy Scale: Poor Standing balance comment: patient unable to stand safely with RW, several instances of losing balance backwards while holding RW and leaning into PT who provided Min A to prevent fall.                             Pertinent Vitals/Pain Pain Assessment: Faces Faces Pain Scale: Hurts a little bit Pain Location: upper abdomen Pain Intervention(s): Limited activity within patient's tolerance;Monitored during session;Repositioned    Home Living Family/patient expects to be discharged to:: Private residence Living Arrangements: Other relatives (Lives with sister Tammy Clayton) Available Help at Discharge: Family;Other (Comment);Available 24 hours/day (goes to [facility] day program each day; sister has health problems and cannot provide any physical assist at home) Type of Home: House Home Access: Stairs to enter Entrance Stairs-Rails: None Entrance Stairs-Number of Steps: 1 step with chair that pt holds onto Home Layout: One level Home Equipment: Shower seat;Toilet riser;Grab bars - tub/shower      Prior Function Level of Independence: Needs assistance   Gait / Transfers Assistance Needed: Tammy Clayton reports pt was I with ambulation with no AD at baseline until last week when she started falling for no apparent reason. Has had 4 falls in the last week and prior to that had not fallen in the last 6 months.  ADL's / Homemaking Assistance Needed: Needs assistance with ADLs and IADLs. Pateint was able to dress and bathe with set-up, but recently has been putting clothing on backwards. Patient has advanced dementia and requires help with all IADLs including cooking, meal prep.  Comments: Pateint oriented to self only. Reduced cognition impairs her ability to understand and follow commands.     Hand Dominance        Extremity/Trunk Assessment   Upper Extremity Assessment Upper Extremity Assessment: Generalized weakness    Lower  Extremity Assessment Lower Extremity Assessment: Generalized weakness    Cervical / Trunk Assessment Cervical / Trunk Assessment: Kyphotic  Communication   Communication: Other (comment) (cognition impairs her ability to understand and follow commands)  Cognition Arousal/Alertness: Awake/alert Behavior During Therapy: WFL for tasks assessed/performed (difficulty staying on task) Overall Cognitive Status: Impaired/Different from baseline Area of Impairment: Following commands;Awareness;Problem solving;Safety/judgement                       Following Commands: Follows one step commands inconsistently Safety/Judgement: Decreased awareness of safety;Decreased awareness of deficits   Problem Solving: Slow processing;Decreased initiation;Difficulty sequencing;Requires verbal cues;Requires tactile cues General Comments: Patient with baseline of advanced dementia but sister Tammy Clayton reports it has declined further and rapidly in the last week. Patient requires significant time and repeated cuing to follow commands inconsistently. Has difficulty initiating movement and with probelm solving, sequencing, safety awareness, situational awareness, etc.      General Comments General comments (skin integrity, edema, etc.): swelling noted at right hand    Exercises Other Exercises Other Exercises: educated pt and sister/caregiver Tammy Clayton on role of PT in acute care setting, discharge reccomendations, education on deficits observed.   Assessment/Plan    PT Assessment Patient needs continued PT services  PT Problem List Decreased strength;Decreased activity tolerance;Decreased balance;Decreased mobility;Decreased knowledge of precautions;Decreased safety awareness;Decreased knowledge of use of DME;Decreased cognition;Decreased coordination       PT Treatment Interventions DME instruction;Balance training;Gait training;Neuromuscular re-education;Cognitive remediation;Stair  training;Functional mobility training;Therapeutic activities;Therapeutic exercise;Patient/family education    PT Goals (Current goals can be found in the Care Plan section)  Acute Rehab PT Goals Patient Stated Goal: return home PT Goal Formulation: With patient/family Time For Goal Achievement: 11/17/19 Potential to Achieve Goals: Fair    Frequency Min 2X/week   Barriers to discharge Decreased caregiver support Sister is unable to provide physical assistance due to her own medical limitations. Pateint currently requires physical assist for basic mobility and to prevent falls.    Co-evaluation               AM-PAC PT "6 Clicks" Mobility  Outcome Measure Help needed turning from your back to your side while in a flat bed without using bedrails?: A Lot Help needed moving from lying on your back to sitting on the side of a flat bed without using bedrails?: A Little Help needed moving to and from a bed to a chair (including a wheelchair)?: A Little Help needed standing up from a chair using your arms (e.g., wheelchair or bedside chair)?: A Little Help needed to walk in hospital room?: A Little Help needed climbing 3-5 steps with a railing? : Total 6 Click Score: 15    End of Session Equipment Utilized During Treatment: Gait belt Activity Tolerance: Patient tolerated treatment well;No increased pain;Other (comment) (limited by poor cognition) Patient left: in bed;with family/visitor present;with call bell/phone within reach;with bed alarm set Nurse Communication: Mobility status PT Visit Diagnosis: Unsteadiness on feet (R26.81);Repeated  falls (R29.6);Muscle weakness (generalized) (M62.81);History of falling (Z91.81);Difficulty in walking, not elsewhere classified (R26.2)    Time: 1319-1400 PT Time Calculation (min) (ACUTE ONLY): 41 min   Charges:   PT Evaluation $PT Eval Moderate Complexity: 1 Mod PT Treatments $Gait Training: 8-22 mins $Therapeutic Activity: 8-22 mins        Luretha Murphy. Ilsa Iha, PT, DPT 11/03/19, 2:59 PM

## 2019-11-03 NOTE — Progress Notes (Signed)
Spoke with great niece Lenard Simmer, [phone] via phone. Per Tish, family is wanting [facility] and an additional option to consider for patient to transfer to at discharge.   Madie Reno, RN

## 2019-11-03 NOTE — TOC Progression Note (Signed)
Transition of Care [facility]) - Progression Note    Patient Details  Name: Tammy Clayton MRN: [id number] Date of Birth: [birth year]  [facility]) CM/SW Contact  Meriel Flavors, LCSW Phone Number: 11/03/2019, 3:05 PM  Clinical Narrative:    6/26: CSW met with patient and her sister, explained what the PT recommended and They agreed to SNF placement. [name] requested we try [facility] first because that is close to her. I am starting the process now.         Expected Discharge Plan and Services                                                 Social Determinants of Health (SDOH) Interventions    Readmission Risk Interventions No flowsheet data found.

## 2019-11-03 NOTE — Progress Notes (Signed)
PROGRESS NOTE    Tammy Clayton  HUD:[id number] DOB: [birth year] DOA: 11/02/2019 PCP: System, Pcp Not In    Brief Narrative:  Tammy Clayton is a 86 y.o. female with medical history significant for hypertension, seizure disorder, Alzheimer's dementia, and history of frequent falls who was sent to the emergency room for evaluation of a 3-day history of pain in the right arm and agitation beyond her baseline for dementia.  She has had no fever or chills, no reports of vomiting or diarrhea or abdominal pain.  No recent falls.  Denies cough, chest pain and shortness of breath.  She was in her usual state of health until she developed right forearm pain about 3 days prior.     Consultants:     Procedures:   Antimicrobials:   ceftriaxone   Subjective: Pt appears confused, has dementia. Denies pain.   Objective: Vitals:   11/02/19 2221 11/03/19 0253 11/03/19 0447 11/03/19 1211  BP: (!) 186/97 (!) 190/98 (!) 165/90 (!) 169/102  Pulse: 85 95 88 92  Resp: 20 20  14   Temp: 98.7 F (37.1 C) 98.2 F (36.8 C)  97.7 F (36.5 C)  TempSrc:  Oral  Oral  SpO2: 100% 99%  99%  Weight:      Height:       No intake or output data in the 24 hours ending 11/03/19 1523 Filed Weights   11/02/19 1617  Weight: 63.5 kg    Examination:  General exam: Appears calm and comfortable  Respiratory system: Clear to auscultation. Respiratory effort normal. Cardiovascular system: S1 & S2 heard, RRR. No JVD, murmurs, rubs, gallops or clicks. No pedal edema. Gastrointestinal system: Abdomen is nondistended, soft and nontender.  Normal bowel sounds heard. Central nervous system: awake and alert, not oriented, grossly intact Extremities: no edema Skin: warm, dry Psychiatry: Mood & affect appropriate in current setting.     Data Reviewed: I have personally reviewed following labs and imaging studies  CBC: Recent Labs  Lab 11/02/19 1628  WBC 7.8  HGB 11.4*  HCT 36.8  MCV 75.1*  PLT 858    Basic Metabolic Panel: Recent Labs  Lab 11/02/19 1628 11/03/19 0900 11/03/19 1305  NA 137 137  --   K 3.0* 3.0* 3.2*  CL 96* 98  --   CO2 30 27  --   GLUCOSE 120* 151*  --   BUN 12 9  --   CREATININE 0.82 0.62  --   CALCIUM 9.2 9.1  --    GFR: Estimated Creatinine Clearance: 45 mL/min (by C-G formula based on SCr of 0.62 mg/dL). Liver Function Tests: Recent Labs  Lab 11/02/19 1628  AST 26  ALT 14  ALKPHOS 102  BILITOT 0.8  PROT 9.0*  ALBUMIN 3.8   No results for input(s): LIPASE, AMYLASE in the last 168 hours. No results for input(s): AMMONIA in the last 168 hours. Coagulation Profile: No results for input(s): INR, PROTIME in the last 168 hours. Cardiac Enzymes: No results for input(s): CKTOTAL, CKMB, CKMBINDEX, TROPONINI in the last 168 hours. BNP (last 3 results) No results for input(s): PROBNP in the last 8760 hours. HbA1C: No results for input(s): HGBA1C in the last 72 hours. CBG: No results for input(s): GLUCAP in the last 168 hours. Lipid Profile: No results for input(s): CHOL, HDL, LDLCALC, TRIG, CHOLHDL, LDLDIRECT in the last 72 hours. Thyroid Function Tests: No results for input(s): TSH, T4TOTAL, FREET4, T3FREE, THYROIDAB in the last 72 hours. Anemia Panel: No results for input(s): VITAMINB12,  FOLATE, FERRITIN, TIBC, IRON, RETICCTPCT in the last 72 hours. Sepsis Labs: Recent Labs  Lab 11/02/19 1628 11/02/19 2013  LATICACIDVEN 1.3 1.0    Recent Results (from the past 240 hour(s))  Culture, blood (routine x 2)     Status: None (Preliminary result)   Collection Time: 11/02/19  6:20 PM   Specimen: BLOOD  Result Value Ref Range Status   Specimen Description BLOOD  Final   Special Requests BOTTLES DRAWN AEROBIC AND ANAEROBIC  Final   Culture   Final    NO GROWTH < 12 HOURS Performed at [facility], [address] [place], [place] [address]    Report Status PENDING  Incomplete  Culture, blood (routine x 2)     Status: None  (Preliminary result)   Collection Time: 11/02/19  8:13 PM   Specimen: BLOOD  Result Value Ref Range Status   Specimen Description BLOOD RIGHT ANTECUBITAL  Final   Special Requests   Final    BOTTLES DRAWN AEROBIC AND ANAEROBIC Blood Culture adequate volume   Culture   Final    NO GROWTH < 12 HOURS Performed at [facility], [address], [place], [place] [address]    Report Status PENDING  Incomplete  SARS CORONAVIRUS 2 (TAT 6-24 HRS) Nasopharyngeal Nasopharyngeal Swab     Status: None   Collection Time: 11/02/19  8:22 PM   Specimen: Nasopharyngeal Swab  Result Value Ref Range Status   SARS Coronavirus 2 NEGATIVE NEGATIVE Final    Comment: (NOTE) SARS-CoV-2 target nucleic acids are NOT DETECTED.  The SARS-CoV-2 RNA is generally detectable in upper and lower respiratory specimens during the acute phase of infection. Negative results do not preclude SARS-CoV-2 infection, do not rule out co-infections with other pathogens, and should not be used as the sole basis for treatment or other patient management decisions. Negative results must be combined with clinical observations, patient history, and epidemiological information. The expected result is Negative.  Fact Sheet for Patients: HairSlick.no  Fact Sheet for Healthcare Providers: quierodirigir.com  This test is not yet approved or cleared by the [place] FDA and  has been authorized for detection and/or diagnosis of SARS-CoV-2 by FDA under an Emergency Use Authorization (EUA). This EUA will remain  in effect (meaning this test can be used) for the duration of the COVID-19 declaration under Se ction 564(b)(1) of the Act, 21 U.S.C. section 360bbb-3(b)(1), unless the authorization is terminated or revoked sooner.  Performed at [facility], [address], [place], [place] [address]          Radiology Studies: DG Chest 1 View  Result Date:  11/02/2019 CLINICAL DATA:  History of multiple falls. EXAM: CHEST  1 VIEW COMPARISON:  June 05, 2011 FINDINGS: There is no evidence of acute infiltrate, pleural effusion or pneumothorax. The heart size and mediastinal contours are within normal limits. An intact left shoulder replacement is noted. No acute osseous abnormalities are seen. IMPRESSION: No active disease. Electronically Signed   By: Aram Candela M.D.   On: 11/02/2019 18:52   DG Shoulder Right  Result Date: 11/02/2019 CLINICAL DATA:  History of multiple falls. EXAM: RIGHT SHOULDER - 2+ VIEW COMPARISON:  None. FINDINGS: There is no evidence of an acute fracture or dislocation. A chronic seventh right rib deformity is seen. Moderate severity chronic and degenerative changes seen involving the right acromioclavicular joint and right glenohumeral articulation. Soft tissues are unremarkable. IMPRESSION: Chronic and degenerative changes without evidence of an acute fracture  or dislocation. Electronically Signed   By: Aram Candela M.D.   On: 11/02/2019 18:50   DG Elbow 2 Views Right  Result Date: 11/02/2019 CLINICAL DATA:  Status post trauma. EXAM: RIGHT ELBOW - 2 VIEW COMPARISON:  None. FINDINGS: There is no evidence of fracture, dislocation, or joint effusion. There is no evidence of arthropathy or other focal bone abnormality. There is mild dorsal soft tissue swelling. IMPRESSION: Mild dorsal soft tissue swelling without evidence of an acute fracture or dislocation. Electronically Signed   By: Aram Candela M.D.   On: 11/02/2019 18:51   DG Wrist Complete Right  Result Date: 11/02/2019 CLINICAL DATA:  History of multiple falls. EXAM: RIGHT WRIST - COMPLETE 3+ VIEW COMPARISON:  None. FINDINGS: There is no evidence of an acute fracture or dislocation. Marked severity degenerative changes are seen involving the carpometacarpal articulation of the left thumb. Mild diffuse degenerative changes are also seen throughout the right wrist.  Soft tissues are unremarkable. IMPRESSION: Advanced degenerative changes at the carpometacarpal articulation of the left thumb. Electronically Signed   By: Aram Candela M.D.   On: 11/02/2019 18:49        Scheduled Meds: . enoxaparin (LOVENOX) injection  40 mg Subcutaneous Q24H  . levETIRAcetam  750 mg Oral BID  . lisinopril  20 mg Oral Daily  . phenytoin  200 mg Oral QHS   Continuous Infusions: . cefTRIAXone (ROCEPHIN)  IV      Assessment & Plan:   Active Problems:   Cellulitis of right arm   Acute confusion due to infection   Dementia with behavioral disturbance (HCC)   HTN (hypertension)   Seizure disorder (HCC)   Frequent falls   1.?Cellulitis of right hand -Patient with noted swelling of the dorsum of the right hand, though without redness or significant tenderness, with temperature of 101 on arrival.  Normal WBC no other source of infection.  Chest x-ray with no acute disease and urinalysis unremarkable -X-rays of right shoulder elbow and forearm showing no acute injury -Was started on ceftriaxone and vancomycin in the ER -Continue ceftriaxone for mild to moderate cellulitis -Follow blood cultures 6/26: clinically improving. Will continue ivabx. Spoke to NP from [facility], who mentioned swelling and erythema. Also stated pt had difficulty walking .PT ordered, recommend SNF    Acute confusion suspect due to infection, superimposed on Dementia with behavioral disturbance (HCC) -Treat infection as above -Haldol as needed agitation if not improving with redirection -Neurologic checks  HTN (hypertension)  -elevated. Restarted lisinopril 20mg  this am, still elevated, will change to bid dose.  Ck renal function /k in am    Seizure disorder (HCC) -Continue home Keppra and Dilantin pending med rec    Frequent falls -Fall precautions  DVT prophylaxis: Lovenox  Code Status: full code  Family Communication:  called sister went to vm.  Disposition Plan:  snf Anticipated d/c: tbd as pt requires snf. Case mx notified.        LOS: 0 days   Time spent:45 min with >50% on co    , MD [facility] Pager [phone]  If 7PM-7AM, please contact night-coverage [email or web address] Password [facility] 11/03/2019, 3:23 PM

## 2019-11-04 DIAGNOSIS — F0391 Unspecified dementia with behavioral disturbance: Secondary | ICD-10-CM

## 2019-11-04 LAB — BASIC METABOLIC PANEL
Anion gap: 9 (ref 5–15)
BUN: 15 mg/dL (ref 8–23)
CO2: 30 mmol/L (ref 22–32)
Calcium: 9.2 mg/dL (ref 8.9–10.3)
Chloride: 99 mmol/L (ref 98–111)
Creatinine, Ser: 0.89 mg/dL (ref 0.44–1.00)
GFR calc Af Amer: >60 mL/min (ref >60)
GFR calc non Af Amer: 59 mL/min — ABNORMAL LOW (ref >60)
Glucose, Bld: 115 mg/dL — ABNORMAL HIGH (ref 70–99)
Potassium: 3.1 mmol/L — ABNORMAL LOW (ref 3.5–5.1)
Sodium: 138 mmol/L (ref 135–145)

## 2019-11-04 MED ORDER — SODIUM CHLORIDE 0.9 % IV SOLN
INTRAVENOUS | Status: DC | PRN
  Administered 2019-11-04: 500 mL via INTRAVENOUS

## 2019-11-04 MED ORDER — AMLODIPINE BESYLATE 5 MG PO TABS
2.5000 mg | ORAL_TABLET | Freq: Every day | ORAL | Status: DC
  Administered 2019-11-04: 2.5 mg via ORAL
  Filled 2019-11-04: qty 1

## 2019-11-04 MED ORDER — POTASSIUM CHLORIDE CRYS ER 20 MEQ PO TBCR
40.0000 meq | EXTENDED_RELEASE_TABLET | Freq: Two times a day (BID) | ORAL | Status: AC
  Administered 2019-11-04 (×2): 40 meq via ORAL
  Filled 2019-11-04 (×2): qty 2

## 2019-11-04 NOTE — Evaluation (Signed)
Occupational Therapy Evaluation Patient Details Name: Tammy Clayton MRN: [id number] DOB: 02-11-1933 Today's Date: 11/04/2019    History of Present Illness Tammy Clayton is a  86 year old female with past medical history of dementia, hypertension, and seizures presents to the ED complaining of 3 days of worsening right arm pain.  She is noted to have a fever on upon arrival to the ED but remainder of vital signs are not consistent with sepsis.  She does have what appears to be a cellulitis to her right hand and distal forearm with no purulent drainage or skin lesions. She has a long history of falls, but has not had any recent witnessed falls.  Patient comes from the [facility], where they were also concerned that she has been not acting like her usual self.  She has reportedly been more agitated than usual and had a temperature of 99 at [facility]. Admitted for R arm cellulitis and fever with unspecified cause.   Clinical Impression   [name] was seen for OT evaluation this date. Prior to hospital admission, pt was Independent c mobility and requires assist for I/ADLs 2/2 baseline dementia. Sister at bed side reports she is home c pt but unable to provide physical assistance. Sister, Tammy Clayton, reports pt has had 4 falls in last 2 weeks and has had decline in cognitive status. Pt presents to acute OT demonstrating impaired ADL performance, functional cognition, and functional mobility 2/2 decreased command following, functional strength/ROM/balance deficits, impaired functional use of dominant RUE, and pain.   Pt currently requires MOD A sit<>stand x2 trials c significant posterior lean - pt locked legs and unable to cue for proper form. 2nd standing trial pt able to follow VCs from sister and OT to take 3 side steps at EOB c MAX A. MOD A don/doff gown at bed level. TOTAL A toileting bed level including perihygiene -  urinary urgency and pt unaware when she has voided. TOTAL A for LBD at  bed level. Pt would benefit from skilled OT to address noted impairments and functional limitations (see below for any additional details) in order to maximize safety and independence while minimizing falls risk and caregiver burden. Upon hospital discharge, recommend STR to maximize pt safety and return to PLOF.     Follow Up Recommendations  SNF    Equipment Recommendations   (TBD at next venue of care)    Recommendations for Other Services       Precautions / Restrictions Precautions Precautions: Fall Restrictions Weight Bearing Restrictions: No      Mobility Bed Mobility Overal bed mobility: Needs Assistance Bed Mobility: Supine to Sit;Sit to Supine     Supine to sit: Max assist Sit to supine: Max assist   General bed mobility comments: MAX A sup<>sit c increased time - c/o R ankle pain limiting RLE mgmt   Transfers Overall transfer level: Needs assistance Equipment used: 1 person hand held assist Transfers: Sit to/from Stand Sit to Stand: Mod assist         General transfer comment: MOD A sit<>stand x2 trials c significant posterior lean - pt locked legs and unable to cue for proper form. 2nd standing trial pt able to follow VCs from sister at bed side and OT to take 3 side steps at EOB c MAX A     Balance Overall balance assessment: Needs assistance Sitting-balance support: Feet supported;No upper extremity supported Sitting balance-Leahy Scale: Fair   Postural control: Left lateral lean Standing balance support: Bilateral upper  extremity supported Standing balance-Leahy Scale: Poor Standing balance comment: Posterior lean c legs locked in standing                            ADL either performed or assessed with clinical judgement   ADL Overall ADL's : Needs assistance/impaired                                       General ADL Comments: Urinary urgency and unaware when she has voided. MOD A don/doff gown at bed level. TOTAL A  toileting bed level including perihygiene. TOTAL A for LBD at bed level     Vision         Perception     Praxis      Pertinent Vitals/Pain Pain Assessment: Faces Faces Pain Scale: Hurts even more Pain Location: RUE and R ankle  Pain Descriptors / Indicators: Aching;Grimacing;Shooting Pain Intervention(s): Limited activity within patient's tolerance;Patient requesting pain meds-RN notified;Repositioned     Hand Dominance Right   Extremity/Trunk Assessment Upper Extremity Assessment Upper Extremity Assessment: Generalized weakness;RUE deficits/detail RUE Deficits / Details: Unable to make fist - can hold tooth paste in R hand but unable to use R hand to open or open c L hand    Lower Extremity Assessment Lower Extremity Assessment: Generalized weakness   Cervical / Trunk Assessment Cervical / Trunk Assessment: Kyphotic   Communication Communication Communication: No difficulties   Cognition Arousal/Alertness: Awake/alert Behavior During Therapy: WFL for tasks assessed/performed Overall Cognitive Status: Impaired/Different from baseline Area of Impairment: Following commands;Awareness;Problem solving;Safety/judgement                       Following Commands: Follows one step commands with increased time;Follows one step commands inconsistently Safety/Judgement: Decreased awareness of safety;Decreased awareness of deficits   Problem Solving: Slow processing;Decreased initiation;Difficulty sequencing;Requires verbal cues;Requires tactile cues General Comments: Patient with baseline of advanced dementia but sister Tammy Clayton reports it has declined further and rapidly in the last week. Patient requires significant time and repeated cuing to follow commands inconsistently. Has difficulty initiating movement and with probelm solving, sequencing, safety awareness, situational awareness, etc.   General Comments  Seated EOB following standing trial: SpO2 97% on RA, HR 91      Exercises Exercises: Other exercises Other Exercises Other Exercises: Pt and caregiver educated re: OT role, DME recs, d/c recs, importance of OOB for functional strengthening, falls prevention, ECS Other Exercises: Toileting, grooming, don/doff gown, sitting/standing balance/tolerance, sup<>sit, sit<>stand x2, steps EOB   Shoulder Instructions      Home Living Family/patient expects to be discharged to:: Private residence Living Arrangements: Other relatives (Lives with sister Tammy Clayton) Available Help at Discharge: Family;Other (Comment);Available 24 hours/day ([facility] day program daily; sister cannot provide physical aid) Type of Home: House Home Access: Stairs to enter [employer] of Steps: 1 step with chair that pt holds onto Entrance Stairs-Rails: None Home Layout: One level     Bathroom Shower/Tub: Teacher, early years/pre: Standard     Home Equipment: Civil engineer, contracting;Toilet riser;Grab bars - tub/shower          Prior Functioning/Environment Level of Independence: Needs assistance  Gait / Transfers Assistance Needed: Tammy Clayton reports pt was I with ambulation with no AD at baseline until last week when she started falling for no apparent reason. Has had 4 falls  in the last week and prior to that had not fallen in the last 6 months. ADL's / Homemaking Assistance Needed: Needs assistance with ADLs and IADLs. Pateint was able to dress and bathe with set-up, but recently has been putting clothing on backwards. Patient has advanced dementia and requires help with all IADLs including cooking, meal prep.   Comments: Pateint oriented to self only. Reduced cognition impairs her ability to understand and follow commands.        OT Problem List: Decreased strength;Decreased range of motion;Decreased activity tolerance;Impaired balance (sitting and/or standing);Decreased coordination;Decreased safety awareness;Impaired UE functional use;Pain      OT  Treatment/Interventions: Self-care/ADL training;Therapeutic exercise;DME and/or AE instruction;Energy conservation;Therapeutic activities;Patient/family education;Balance training    OT Goals(Current goals can be found in the care plan section) Acute Rehab OT Goals Patient Stated Goal: return home OT Goal Formulation: With patient/family Time For Goal Achievement: 11/18/19 Potential to Achieve Goals: Fair ADL Goals Pt Will Perform Grooming: with set-up;sitting;with supervision Pt Will Perform Upper Body Dressing: with set-up;sitting Pt Will Transfer to Toilet: with mod assist;stand pivot transfer;bedside commode (c LRAD PRN)  OT Frequency: Min 1X/week   Barriers to D/C: Inaccessible home environment;Decreased caregiver support  +       Co-evaluation              AM-PAC OT "6 Clicks" Daily Activity     Outcome Measure Help from another person eating meals?: A Little Help from another person taking care of personal grooming?: A Little Help from another person toileting, which includes using toliet, bedpan, or urinal?: A Lot Help from another person bathing (including washing, rinsing, drying)?: A Lot Help from another person to put on and taking off regular upper body clothing?: A Little Help from another person to put on and taking off regular lower body clothing?: A Lot 6 Click Score: 15   End of Session Nurse Communication: Patient requests pain meds;Mobility status  Activity Tolerance: Patient tolerated treatment well Patient left: in bed;with call bell/phone within reach;with bed alarm set;with family/visitor present  OT Visit Diagnosis: Other abnormalities of gait and mobility (R26.89);Repeated falls (R29.6);Muscle weakness (generalized) (M62.81);Unsteadiness on feet (R26.81)                Time: 1420-1450 OT Time Calculation (min): 30 min Charges:  OT General Charges $OT Visit: 1 Visit OT Evaluation $OT Eval Moderate Complexity: 1 Mod OT Treatments $Self  Care/Home Management : 23-37 mins  Kathie Dike, M.S. OTR/L  11/04/19, 3:20 PM

## 2019-11-04 NOTE — Progress Notes (Signed)
PROGRESS NOTE    Tammy Clayton  FUX:[id number] DOB: 08-Sep-1932 DOA: 11/02/2019 PCP: System, Pcp Not In    Brief Narrative:  Tammy Clayton is a 86 y.o. female with medical history significant for hypertension, seizure disorder, Alzheimer's dementia, and history of frequent falls who was sent to the emergency room for evaluation of a 3-day history of pain in the right arm and agitation beyond her baseline for dementia.  She has had no fever or chills, no reports of vomiting or diarrhea or abdominal pain.  No recent falls.  Denies cough, chest pain and shortness of breath.  She was in her usual state of health until she developed right forearm pain about 3 days prior.     Consultants:     Procedures:   Antimicrobials:   ceftriaxone   Subjective:  Patient at baseline confusion.  Very pleasant, no complaints  Objective: Vitals:   11/03/19 1211 11/03/19 1936 11/04/19 0421 11/04/19 1214  BP: (!) 169/102 (!) 172/94 113/84 (!) 166/85  Pulse: 92 86 76 90  Resp: 14 20 18 16   Temp: 97.7 F (36.5 C) 98.6 F (37 C) 98.2 F (36.8 C) 98.2 F (36.8 C)  TempSrc: Oral Oral  Oral  SpO2: 99% 100% 99% 100%  Weight:      Height:        Intake/Output Summary (Last 24 hours) at 11/04/2019 1241 Last data filed at 11/04/2019 0447 Gross per 24 hour  Intake 460 ml  Output --  Net 460 ml   Filed Weights   11/02/19 1617  Weight: 63.5 kg    Examination:  General exam: Appears calm and comfortable, sitting in bed eating breakfast, pleasant respiratory system: Clear to auscultation. Respiratory effort normal.  No wheeze rales rhonchi's Cardiovascular system: S1 & S2 heard, RRR. No JVD, murmurs, rubs, gallops or clicks. No pedal edema. Gastrointestinal system: Abdomen is nondistended, soft and nontender.  Normal bowel sounds heard.  No rebound Central nervous system: awake and alert, not oriented at all, grossly intact Extremities: no edema.  Right upper extremity minimal erythema and  edema, overall improved from hand to mid forearm Skin: warm, dry Psychiatry: Mood & affect appropriate in current setting.     Data Reviewed: I have personally reviewed following labs and imaging studies  CBC: Recent Labs  Lab 11/02/19 1628  WBC 7.8  HGB 11.4*  HCT 36.8  MCV 75.1*  PLT 025   Basic Metabolic Panel: Recent Labs  Lab 11/02/19 1628 11/03/19 0900 11/03/19 1305 11/04/19 0549  NA 137 137  --  138  K 3.0* 3.0* 3.2* 3.1*  CL 96* 98  --  99  CO2 30 27  --  30  GLUCOSE 120* 151*  --  115*  BUN 12 9  --  15  CREATININE 0.82 0.62  --  0.89  CALCIUM 9.2 9.1  --  9.2   GFR: Estimated Creatinine Clearance: 40.5 mL/min (by C-G formula based on SCr of 0.89 mg/dL). Liver Function Tests: Recent Labs  Lab 11/02/19 1628  AST 26  ALT 14  ALKPHOS 102  BILITOT 0.8  PROT 9.0*  ALBUMIN 3.8   No results for input(s): LIPASE, AMYLASE in the last 168 hours. No results for input(s): AMMONIA in the last 168 hours. Coagulation Profile: No results for input(s): INR, PROTIME in the last 168 hours. Cardiac Enzymes: No results for input(s): CKTOTAL, CKMB, CKMBINDEX, TROPONINI in the last 168 hours. BNP (last 3 results) No results for input(s): PROBNP in the last 8760  hours. HbA1C: No results for input(s): HGBA1C in the last 72 hours. CBG: No results for input(s): GLUCAP in the last 168 hours. Lipid Profile: No results for input(s): CHOL, HDL, LDLCALC, TRIG, CHOLHDL, LDLDIRECT in the last 72 hours. Thyroid Function Tests: No results for input(s): TSH, T4TOTAL, FREET4, T3FREE, THYROIDAB in the last 72 hours. Anemia Panel: No results for input(s): VITAMINB12, FOLATE, FERRITIN, TIBC, IRON, RETICCTPCT in the last 72 hours. Sepsis Labs: Recent Labs  Lab 11/02/19 1628 11/02/19 2013  LATICACIDVEN 1.3 1.0    Recent Results (from the past 240 hour(s))  Culture, blood (routine x 2)     Status: None (Preliminary result)   Collection Time: 11/02/19  6:20 PM   Specimen:  BLOOD  Result Value Ref Range Status   Specimen Description BLOOD  Final   Special Requests BOTTLES DRAWN AEROBIC AND ANAEROBIC  Final   Culture   Final    NO GROWTH 2 DAYS Performed at [facility], [address], [place], [place] [address]    Report Status PENDING  Incomplete  Culture, blood (routine x 2)     Status: None (Preliminary result)   Collection Time: 11/02/19  8:13 PM   Specimen: BLOOD  Result Value Ref Range Status   Specimen Description BLOOD RIGHT ANTECUBITAL  Final   Special Requests   Final    BOTTLES DRAWN AEROBIC AND ANAEROBIC Blood Culture adequate volume   Culture   Final    NO GROWTH 2 DAYS Performed at [facility], [address], [place], [place] [address]    Report Status PENDING  Incomplete  SARS CORONAVIRUS 2 (TAT 6-24 HRS) Nasopharyngeal Nasopharyngeal Swab     Status: None   Collection Time: 11/02/19  8:22 PM   Specimen: Nasopharyngeal Swab  Result Value Ref Range Status   SARS Coronavirus 2 NEGATIVE NEGATIVE Final    Comment: (NOTE) SARS-CoV-2 target nucleic acids are NOT DETECTED.  The SARS-CoV-2 RNA is generally detectable in upper and lower respiratory specimens during the acute phase of infection. Negative results do not preclude SARS-CoV-2 infection, do not rule out co-infections with other pathogens, and should not be used as the sole basis for treatment or other patient management decisions. Negative results must be combined with clinical observations, patient history, and epidemiological information. The expected result is Negative.  Fact Sheet for Patients: HairSlick.no  Fact Sheet for Healthcare Providers: quierodirigir.com  This test is not yet approved or cleared by the [place] FDA and  has been authorized for detection and/or diagnosis of SARS-CoV-2 by FDA under an Emergency Use Authorization (EUA). This EUA will remain  in effect (meaning this  test can be used) for the duration of the COVID-19 declaration under Se ction 564(b)(1) of the Act, 21 U.S.C. section 360bbb-3(b)(1), unless the authorization is terminated or revoked sooner.  Performed at [facility], [address] [place], [place] [address]          Radiology Studies: DG Chest 1 View  Result Date: 11/02/2019 CLINICAL DATA:  History of multiple falls. EXAM: CHEST  1 VIEW COMPARISON:  June 05, 2011 FINDINGS: There is no evidence of acute infiltrate, pleural effusion or pneumothorax. The heart size and mediastinal contours are within normal limits. An intact left shoulder replacement is noted. No acute osseous abnormalities are seen. IMPRESSION: No active disease. Electronically Signed   By: Aram Candela M.D.   On: 11/02/2019 18:52   DG Shoulder Right  Result Date: 11/02/2019 CLINICAL DATA:  History  of multiple falls. EXAM: RIGHT SHOULDER - 2+ VIEW COMPARISON:  None. FINDINGS: There is no evidence of an acute fracture or dislocation. A chronic seventh right rib deformity is seen. Moderate severity chronic and degenerative changes seen involving the right acromioclavicular joint and right glenohumeral articulation. Soft tissues are unremarkable. IMPRESSION: Chronic and degenerative changes without evidence of an acute fracture or dislocation. Electronically Signed   By: Aram Candela M.D.   On: 11/02/2019 18:50   DG Elbow 2 Views Right  Result Date: 11/02/2019 CLINICAL DATA:  Status post trauma. EXAM: RIGHT ELBOW - 2 VIEW COMPARISON:  None. FINDINGS: There is no evidence of fracture, dislocation, or joint effusion. There is no evidence of arthropathy or other focal bone abnormality. There is mild dorsal soft tissue swelling. IMPRESSION: Mild dorsal soft tissue swelling without evidence of an acute fracture or dislocation. Electronically Signed   By: Aram Candela M.D.   On: 11/02/2019 18:51   DG Wrist Complete Right  Result Date:  11/02/2019 CLINICAL DATA:  History of multiple falls. EXAM: RIGHT WRIST - COMPLETE 3+ VIEW COMPARISON:  None. FINDINGS: There is no evidence of an acute fracture or dislocation. Marked severity degenerative changes are seen involving the carpometacarpal articulation of the left thumb. Mild diffuse degenerative changes are also seen throughout the right wrist. Soft tissues are unremarkable. IMPRESSION: Advanced degenerative changes at the carpometacarpal articulation of the left thumb. Electronically Signed   By: Aram Candela M.D.   On: 11/02/2019 18:49        Scheduled Meds: . citalopram  20 mg Oral Daily  . enoxaparin (LOVENOX) injection  40 mg Subcutaneous Q24H  . levETIRAcetam  750 mg Oral BID  . lisinopril  20 mg Oral BID  . pantoprazole  40 mg Oral Daily  . phenytoin  200 mg Oral QHS  . traZODone  50 mg Oral QHS   Continuous Infusions: . cefTRIAXone (ROCEPHIN)  IV Stopped (11/03/19 2258)    Assessment & Plan:   Active Problems:   Cellulitis of right arm   Acute confusion due to infection   Dementia with behavioral disturbance (HCC)   HTN (hypertension)   Seizure disorder (HCC)   Frequent falls   1.?Cellulitis of right hand -Patient with noted swelling of the dorsum of the right hand, though without redness or significant tenderness, with temperature of 101 on arrival.  Normal WBC no other source of infection.  Chest x-ray with no acute disease and urinalysis unremarkable -X-rays of right shoulder elbow and forearm showing no acute injury -Was started on ceftriaxone and vancomycin in the ER -Continue ceftriaxone for mild to moderate cellulitis -Follow blood cultures pending and negative to date Overall has improved with Spoke to [facility] from [facility], who mentioned swelling and erythema. Also stated pt had difficulty walking .PT ordered, recommend SNF    Acute confusion suspect due to infection, superimposed on Dementia with behavioral disturbance (HCC) -Treat infection as  above Now appears to be at baseline -Haldol as needed agitation if not improving with redirection -Neurologic checks  Essential HTN (hypertension)  -Better control but has room for improvement Continue lisinopril 20 mg twice daily We will add amlodipine 2.5 mg daily    Seizure disorder (HCC) -Continue home Keppra and Dilantin pending med rec    Frequent falls -Fall precautions PT recommended SNF DVT prophylaxis: Lovenox  Code Status: full code  Family Communication:  None at bedside Disposition Plan: snf pending  Anticipated d/c: tbd as pt requires snf pending  LOS: 1 day   Time spent:35 min with >50% on co    Lynn Ito, MD [facility] Pager [phone]  If 7PM-7AM, please contact night-coverage [email or web address] Password [facility] 11/04/2019, 12:41 PM

## 2019-11-05 DIAGNOSIS — R296 Repeated falls: Secondary | ICD-10-CM

## 2019-11-05 LAB — BASIC METABOLIC PANEL
Anion gap: 12 (ref 5–15)
BUN: 15 mg/dL (ref 8–23)
CO2: 28 mmol/L (ref 22–32)
Calcium: 9.1 mg/dL (ref 8.9–10.3)
Chloride: 98 mmol/L (ref 98–111)
Creatinine, Ser: 0.87 mg/dL (ref 0.44–1.00)
GFR calc Af Amer: >60 mL/min (ref >60)
GFR calc non Af Amer: >60 mL/min (ref >60)
Glucose, Bld: 123 mg/dL — ABNORMAL HIGH (ref 70–99)
Potassium: 3.7 mmol/L (ref 3.5–5.1)
Sodium: 138 mmol/L (ref 135–145)

## 2019-11-05 MED ORDER — AMLODIPINE BESYLATE 5 MG PO TABS: 5.0000 mg | ORAL_TABLET | Freq: Every day | ORAL | 0 refills | Status: DC

## 2019-11-05 MED ORDER — AMOXICILLIN-POT CLAVULANATE 400-57 MG/5ML PO SUSR: 400.0000 mg | Freq: Two times a day (BID) | ORAL | 0 refills | Status: AC

## 2019-11-05 MED ORDER — AMLODIPINE BESYLATE 5 MG PO TABS
5.0000 mg | ORAL_TABLET | Freq: Every day | ORAL | Status: DC
  Administered 2019-11-05: 5 mg via ORAL
  Filled 2019-11-05: qty 1

## 2019-11-05 MED ORDER — AMOXICILLIN-POT CLAVULANATE 400-57 MG/5ML PO SUSR
400.0000 mg | Freq: Two times a day (BID) | ORAL | Status: DC
  Filled 2019-11-05 (×2): qty 5

## 2019-11-05 MED ORDER — LISINOPRIL 20 MG PO TABS: 20.0000 mg | ORAL_TABLET | Freq: Two times a day (BID) | ORAL | 0 refills | Status: DC

## 2019-11-05 NOTE — Discharge Summary (Signed)
[place] GMW:[id number] DOB: 1933-05-02 DOA: 11/02/2019  PCP: System, Pcp Not In  Admit date: 11/02/2019 Discharge date: 11/05/2019  Admitted From: Home Disposition: SNF at [facility]  Recommendations for Outpatient Follow-up:  1. Follow up with PCP in 1 week 2. Please obtain BMP/CBC in one week      Discharge Condition:Stable CODE STATUS: Full Diet recommendation: Heart Healthy  Brief/Interim Summary: Tammy Clayton is a 86 y.o. female with medical history significant for hypertension, seizure disorder, Alzheimer's dementia, and history of frequent falls who was sent to the emergency room for evaluation of a 3-day history of pain in the right arm and agitation beyond her baseline for dementia.  She has had no fever or chills, no reports of vomiting or diarrhea or abdominal pain.  No recent falls.  Denies cough, chest pain and shortness of breath.  She was in her usual state of health until she developed right forearm pain about 3 days prior.  Due to history of dementia most of history taken from emergency room records and [facility]   1.?Cellulitis of righthand -Patient with noted swelling of thedorsum of the right hand, though without redness or significant tenderness,with temperature of 101 on arrival.Normal WBC no other source of infection. Chest x-ray with no acute disease and urinalysis unremarkable -X-rays of right shoulder elbow and forearm showing no acute injury -Was started on ceftriaxone and vancomycin in the ER -Continued ceftriaxone formild tomoderate cellulitis, and will switch to augmentin po to complete course. -Follow blood cultures pending and negative to date   Acute confusion suspectdue to infection, superimposed onDementia with behavioral disturbance (HCC) -Treat infection as above Now appears to be at baseline   Essential HTN (hypertension) -Better control but has room for improvement Continue lisinopril 20 mg twice  daily Added  amlodipine   Seizure disorder ([place]) -Continue home Keppra and Dilantin pending med rec  Frequent falls -Fall precautions PT/OT rec. SNF   Discharge Diagnoses:  Active Problems:   Cellulitis of right arm   Acute confusion due to infection   Dementia with behavioral disturbance (HCC)   HTN (hypertension)   Seizure disorder ([place])   Frequent falls    Discharge Instructions  Discharge Instructions    Call MD for:  temperature >100.4   Complete by: As directed    Diet - low sodium heart healthy   Complete by: As directed    Discharge instructions   Complete by: As directed    Pcp in one week   Increase activity slowly   Complete by: As directed      Allergies as of 11/05/2019   No Known Allergies     Medication List    TAKE these medications   acetaminophen 325 MG tablet Commonly known as: TYLENOL Take 650 mg by mouth every 6 (six) hours as needed for mild pain or moderate pain.   amLODipine 5 MG tablet Commonly known as: NORVASC Take 1 tablet (5 mg total) by mouth daily. Start taking on: November 06, 2019   amoxicillin-clavulanate 400-57 MG/5ML suspension Commonly known as: AUGMENTIN Take 5 mLs (400 mg total) by mouth every 12 (twelve) hours for 3 days.   citalopram 20 MG tablet Commonly known as: CELEXA Take 20 mg by mouth daily.   diclofenac Sodium 1 % Gel Commonly known as: VOLTAREN Apply 2 g topically 3 (three) times daily as needed (wrist pain).   levETIRAcetam 750 MG tablet Commonly known as: KEPPRA Take 750 mg by mouth 2 (two) times  daily.   lisinopril 20 MG tablet Commonly known as: ZESTRIL Take 1 tablet (20 mg total) by mouth in the morning and at bedtime. What changed: when to take this   omeprazole 40 MG capsule Commonly known as: PRILOSEC Take 40 mg by mouth daily.   phenytoin 100 MG ER capsule Commonly known as: DILANTIN Take 200 mg by mouth at bedtime.   polyethylene glycol 17 g packet Commonly known as: MIRALAX  / GLYCOLAX Take 17 g by mouth daily as needed for mild constipation or moderate constipation.   traZODone 50 MG tablet Commonly known as: DESYREL Take 50 mg by mouth at bedtime.       Contact information for after-discharge care    Destination    [facility] .   Service: Skilled Nursing Contact information: [address] [place] [address] [phone]                 No Known Allergies  Consultations:  none   Procedures/Studies: DG Chest 1 View  Result Date: 11/02/2019 CLINICAL DATA:  History of multiple falls. EXAM: CHEST  1 VIEW COMPARISON:  June 05, 2011 FINDINGS: There is no evidence of acute infiltrate, pleural effusion or pneumothorax. The heart size and mediastinal contours are within normal limits. An intact left shoulder replacement is noted. No acute osseous abnormalities are seen. IMPRESSION: No active disease. Electronically Signed   By: Aram Candela M.D.   On: 11/02/2019 18:52   DG Shoulder Right  Result Date: 11/02/2019 CLINICAL DATA:  History of multiple falls. EXAM: RIGHT SHOULDER - 2+ VIEW COMPARISON:  None. FINDINGS: There is no evidence of an acute fracture or dislocation. A chronic seventh right rib deformity is seen. Moderate severity chronic and degenerative changes seen involving the right acromioclavicular joint and right glenohumeral articulation. Soft tissues are unremarkable. IMPRESSION: Chronic and degenerative changes without evidence of an acute fracture or dislocation. Electronically Signed   By: Aram Candela M.D.   On: 11/02/2019 18:50   DG Elbow 2 Views Right  Result Date: 11/02/2019 CLINICAL DATA:  Status post trauma. EXAM: RIGHT ELBOW - 2 VIEW COMPARISON:  None. FINDINGS: There is no evidence of fracture, dislocation, or joint effusion. There is no evidence of arthropathy or other focal bone abnormality. There is mild dorsal soft tissue swelling. IMPRESSION: Mild dorsal soft  tissue swelling without evidence of an acute fracture or dislocation. Electronically Signed   By: Aram Candela M.D.   On: 11/02/2019 18:51   DG Wrist Complete Right  Result Date: 11/02/2019 CLINICAL DATA:  History of multiple falls. EXAM: RIGHT WRIST - COMPLETE 3+ VIEW COMPARISON:  None. FINDINGS: There is no evidence of an acute fracture or dislocation. Marked severity degenerative changes are seen involving the carpometacarpal articulation of the left thumb. Mild diffuse degenerative changes are also seen throughout the right wrist. Soft tissues are unremarkable. IMPRESSION: Advanced degenerative changes at the carpometacarpal articulation of the left thumb. Electronically Signed   By: Aram Candela M.D.   On: 11/02/2019 18:49       Subjective: Has no complaints. Denies sob,cp, dizziness  Discharge Exam: Vitals:   11/05/19 0500 11/05/19 0526  BP: (!) 189/85 (!) 179/90  Pulse: 75   Resp: 16   Temp: 98.7 F (37.1 C)   SpO2: 99%    Vitals:   11/04/19 1214 11/04/19 2012 11/05/19 0500 11/05/19 0526  BP: (!) 166/85 (!) 163/83 (!) 189/85 (!) 179/90  Pulse: 90 81 75  Resp: 16 16 16    Temp: 98.2 F (36.8 C) 98.7 F (37.1 C) 98.7 F (37.1 C)   TempSrc: Oral Oral Oral   SpO2: 100% 100% 99%   Weight:      Height:        General: Pt is alert, awake, not in acute distress Cardiovascular: RRR, S1/S2 +, no rubs, no gallops Respiratory: CTA bilaterally, no wheezing, no rhonchi Abdominal: Soft, NT, ND, bowel sounds + Extremities: no edema, no cyanosis RUE with no edema or erythema. Overall improved    The results of significant diagnostics from this hospitalization (including imaging, microbiology, ancillary and laboratory) are listed below for reference.     Microbiology: Recent Results (from the past 240 hour(s))  Culture, blood (routine x 2)     Status: None (Preliminary result)   Collection Time: 11/02/19  6:20 PM   Specimen: BLOOD  Result Value Ref Range Status    Specimen Description BLOOD  Final   Special Requests BOTTLES DRAWN AEROBIC AND ANAEROBIC  Final   Culture   Final    NO GROWTH 3 DAYS Performed at [facility], [address] [place], [place] [address]    Report Status PENDING  Incomplete  Culture, blood (routine x 2)     Status: None (Preliminary result)   Collection Time: 11/02/19  8:13 PM   Specimen: BLOOD  Result Value Ref Range Status   Specimen Description BLOOD RIGHT ANTECUBITAL  Final   Special Requests   Final    BOTTLES DRAWN AEROBIC AND ANAEROBIC Blood Culture adequate volume   Culture   Final    NO GROWTH 3 DAYS Performed at [facility], [address] [place], [place] [address]    Report Status PENDING  Incomplete  SARS CORONAVIRUS 2 (TAT 6-24 HRS) Nasopharyngeal Nasopharyngeal Swab     Status: None   Collection Time: 11/02/19  8:22 PM   Specimen: Nasopharyngeal Swab  Result Value Ref Range Status   SARS Coronavirus 2 NEGATIVE NEGATIVE Final    Comment: (NOTE) SARS-CoV-2 target nucleic acids are NOT DETECTED.  The SARS-CoV-2 RNA is generally detectable in upper and lower respiratory specimens during the acute phase of infection. Negative results do not preclude SARS-CoV-2 infection, do not rule out co-infections with other pathogens, and should not be used as the sole basis for treatment or other patient management decisions. Negative results must be combined with clinical observations, patient history, and epidemiological information. The expected result is Negative.  Fact Sheet for Patients: HairSlick.nohttps://www.fda.gov/media/138098/download  Fact Sheet for Healthcare Providers: quierodirigir.comhttps://www.fda.gov/media/138095/download  This test is not yet approved or cleared by the [place] FDA and  has been authorized for detection and/or diagnosis of SARS-CoV-2 by FDA under an Emergency Use Authorization (EUA). This EUA will remain  in effect (meaning this test can be used) for the duration of  the COVID-19 declaration under Se ction 564(b)(1) of the Act, 21 U.S.C. section 360bbb-3(b)(1), unless the authorization is terminated or revoked sooner.  Performed at [facility], [address] [place], [place] [address]      Labs: BNP (last 3 results) No results for input(s): BNP in the last 8760 hours. Basic Metabolic Panel: Recent Labs  Lab 11/02/19 1628 11/03/19 0900 11/03/19 1305 11/04/19 0549 11/05/19 0419  NA 137 137  --  138 138  K 3.0* 3.0* 3.2* 3.1* 3.7  CL 96* 98  --  99 98  CO2 30 27  --  30 28  GLUCOSE 120* 151*  --  115* 123*  BUN 12 9  --  15 15  CREATININE 0.82 0.62  --  0.89 0.87  CALCIUM 9.2 9.1  --  9.2 9.1   Liver Function Tests: Recent Labs  Lab 11/02/19 1628  AST 26  ALT 14  ALKPHOS 102  BILITOT 0.8  PROT 9.0*  ALBUMIN 3.8   No results for input(s): LIPASE, AMYLASE in the last 168 hours. No results for input(s): AMMONIA in the last 168 hours. CBC: Recent Labs  Lab 11/02/19 1628  WBC 7.8  HGB 11.4*  HCT 36.8  MCV 75.1*  PLT 186   Cardiac Enzymes: No results for input(s): CKTOTAL, CKMB, CKMBINDEX, TROPONINI in the last 168 hours. BNP: Invalid input(s): POCBNP CBG: No results for input(s): GLUCAP in the last 168 hours. D-Dimer No results for input(s): DDIMER in the last 72 hours. Hgb A1c No results for input(s): HGBA1C in the last 72 hours. Lipid Profile No results for input(s): CHOL, HDL, LDLCALC, TRIG, CHOLHDL, LDLDIRECT in the last 72 hours. Thyroid function studies No results for input(s): TSH, T4TOTAL, T3FREE, THYROIDAB in the last 72 hours.  Invalid input(s): FREET3 Anemia work up No results for input(s): VITAMINB12, FOLATE, FERRITIN, TIBC, IRON, RETICCTPCT in the last 72 hours. Urinalysis    Component Value Date/Time   COLORURINE YELLOW (A) 11/02/2019 1910   APPEARANCEUR CLEAR (A) 11/02/2019 1910   APPEARANCEUR Cloudy 06/05/2011 1321   LABSPEC 1.009 11/02/2019 1910   LABSPEC 1.016 06/05/2011 1321    PHURINE 7.0 11/02/2019 1910   GLUCOSEU NEGATIVE 11/02/2019 1910   GLUCOSEU Negative 06/05/2011 1321   HGBUR SMALL (A) 11/02/2019 1910   BILIRUBINUR NEGATIVE 11/02/2019 1910   BILIRUBINUR Negative 06/05/2011 1321   KETONESUR 5 (A) 11/02/2019 1910   PROTEINUR NEGATIVE 11/02/2019 1910   NITRITE NEGATIVE 11/02/2019 1910   LEUKOCYTESUR NEGATIVE 11/02/2019 1910   LEUKOCYTESUR 2+ 06/05/2011 1321   Sepsis Labs Invalid input(s): PROCALCITONIN,  WBC,  LACTICIDVEN Microbiology Recent Results (from the past 240 hour(s))  Culture, blood (routine x 2)     Status: None (Preliminary result)   Collection Time: 11/02/19  6:20 PM   Specimen: BLOOD  Result Value Ref Range Status   Specimen Description BLOOD  Final   Special Requests BOTTLES DRAWN AEROBIC AND ANAEROBIC  Final   Culture   Final    NO GROWTH 3 DAYS Performed at [facility], [address], [place], [place] [address]    Report Status PENDING  Incomplete  Culture, blood (routine x 2)     Status: None (Preliminary result)   Collection Time: 11/02/19  8:13 PM   Specimen: BLOOD  Result Value Ref Range Status   Specimen Description BLOOD RIGHT ANTECUBITAL  Final   Special Requests   Final    BOTTLES DRAWN AEROBIC AND ANAEROBIC Blood Culture adequate volume   Culture   Final    NO GROWTH 3 DAYS Performed at [facility], [address] [place], [place] [address]    Report Status PENDING  Incomplete  SARS CORONAVIRUS 2 (TAT 6-24 HRS) Nasopharyngeal Nasopharyngeal Swab     Status: None   Collection Time: 11/02/19  8:22 PM   Specimen: Nasopharyngeal Swab  Result Value Ref Range Status   SARS Coronavirus 2 NEGATIVE NEGATIVE Final    Comment: (NOTE) SARS-CoV-2 target nucleic acids are NOT DETECTED.  The SARS-CoV-2 RNA is generally detectable in upper and lower respiratory specimens during the acute phase of infection. Negative results do not preclude SARS-CoV-2 infection, do not rule  out co-infections with other  pathogens, and should not be used as the sole basis for treatment or other patient management decisions. Negative results must be combined with clinical observations, patient history, and epidemiological information. The expected result is Negative.  Fact Sheet for Patients: HairSlick.no  Fact Sheet for Healthcare Providers: quierodirigir.com  This test is not yet approved or cleared by the [place] FDA and  has been authorized for detection and/or diagnosis of SARS-CoV-2 by FDA under an Emergency Use Authorization (EUA). This EUA will remain  in effect (meaning this test can be used) for the duration of the COVID-19 declaration under Se ction 564(b)(1) of the Act, 21 U.S.C. section 360bbb-3(b)(1), unless the authorization is terminated or revoked sooner.  Performed at [facility], [address] [place], [place] [address]      Time coordinating discharge: Over 30 minutes  SIGNED:   Lynn Ito, MD  [facility] 11/05/2019, 1:34 PM Pager   If 7PM-7AM, please contact night-coverage [email or web address] Password [email or web address]

## 2019-11-05 NOTE — Progress Notes (Signed)
Report was called to [facility]. [facility] will transport the patient

## 2019-11-07 LAB — CULTURE, BLOOD (ROUTINE X 2)
Culture: NO GROWTH
Culture: NO GROWTH
Special Requests: ADEQUATE

## 2019-11-16 ENCOUNTER — Other Ambulatory Visit: Payer: Self-pay | Admitting: Internal Medicine

## 2019-11-16 ENCOUNTER — Ambulatory Visit
Admission: RE | Admit: 2019-11-16 | Discharge: 2019-11-16 | Disposition: A | Discharge status: Home / Self Care | Payer: Medicare (Managed Care) | Source: Ambulatory Visit | Attending: Internal Medicine | Admitting: Internal Medicine

## 2019-11-16 ENCOUNTER — Other Ambulatory Visit (HOSPITAL_COMMUNITY): Payer: Self-pay | Admitting: Internal Medicine

## 2019-11-16 ENCOUNTER — Other Ambulatory Visit: Payer: Self-pay

## 2019-11-16 DIAGNOSIS — R5381 Other malaise: Secondary | ICD-10-CM

## 2019-11-16 DIAGNOSIS — I6789 Other cerebrovascular disease: Secondary | ICD-10-CM | POA: Insufficient documentation

## 2021-08-29 ENCOUNTER — Emergency Department: Payer: Medicare (Managed Care)

## 2021-08-29 ENCOUNTER — Emergency Department
Admission: EM | Admit: 2021-08-29 | Discharge: 2021-08-29 | Disposition: A | Discharge status: Home / Self Care | Payer: Medicare (Managed Care) | Attending: Emergency Medicine | Admitting: Emergency Medicine

## 2021-08-29 ENCOUNTER — Other Ambulatory Visit: Payer: Self-pay

## 2021-08-29 DIAGNOSIS — R8271 Bacteriuria: Secondary | ICD-10-CM | POA: Diagnosis not present

## 2021-08-29 DIAGNOSIS — I10 Essential (primary) hypertension: Secondary | ICD-10-CM | POA: Insufficient documentation

## 2021-08-29 DIAGNOSIS — R4182 Altered mental status, unspecified: Secondary | ICD-10-CM | POA: Diagnosis present

## 2021-08-29 DIAGNOSIS — F0390 Unspecified dementia without behavioral disturbance: Secondary | ICD-10-CM | POA: Insufficient documentation

## 2021-08-29 DIAGNOSIS — N179 Acute kidney failure, unspecified: Secondary | ICD-10-CM | POA: Insufficient documentation

## 2021-08-29 LAB — CBC WITH DIFFERENTIAL/PLATELET
Abs Immature Granulocytes: 0.02 10*3/uL (ref 0.00–0.07)
Basophils Absolute: 0 10*3/uL (ref 0.0–0.1)
Basophils Relative: 0 %
Eosinophils Absolute: 0.1 10*3/uL (ref 0.0–0.5)
Eosinophils Relative: 2 %
HCT: 42.2 % (ref 36.0–46.0)
Hemoglobin: 12.2 g/dL (ref 12.0–15.0)
Immature Granulocytes: 0 %
Lymphocytes Relative: 21 %
Lymphs Abs: 1.1 10*3/uL (ref 0.7–4.0)
MCH: 22.1 pg — ABNORMAL LOW (ref 26.0–34.0)
MCHC: 28.9 g/dL — ABNORMAL LOW (ref 30.0–36.0)
MCV: 76.4 fL — ABNORMAL LOW (ref 80.0–100.0)
Monocytes Absolute: 0.3 10*3/uL (ref 0.1–1.0)
Monocytes Relative: 6 %
Neutro Abs: 3.5 10*3/uL (ref 1.7–7.7)
Neutrophils Relative %: 71 %
Platelets: 173 10*3/uL (ref 150–400)
RBC: 5.52 MIL/uL — ABNORMAL HIGH (ref 3.87–5.11)
RDW: 14.8 % (ref 11.5–15.5)
WBC: 5 10*3/uL (ref 4.0–10.5)
nRBC: 0 % (ref 0.0–0.2)

## 2021-08-29 LAB — COMPREHENSIVE METABOLIC PANEL
ALT: 13 U/L (ref 0–44)
AST: 24 U/L (ref 15–41)
Albumin: 4.7 g/dL (ref 3.5–5.0)
Alkaline Phosphatase: 55 U/L (ref 38–126)
Anion gap: 9 (ref 5–15)
BUN: 30 mg/dL — ABNORMAL HIGH (ref 8–23)
CO2: 26 mmol/L (ref 22–32)
Calcium: 9.8 mg/dL (ref 8.9–10.3)
Chloride: 102 mmol/L (ref 98–111)
Creatinine, Ser: 1.29 mg/dL — ABNORMAL HIGH (ref 0.44–1.00)
GFR, Estimated: 40 mL/min — ABNORMAL LOW (ref >60)
Glucose, Bld: 128 mg/dL — ABNORMAL HIGH (ref 70–99)
Potassium: 3.9 mmol/L (ref 3.5–5.1)
Sodium: 137 mmol/L (ref 135–145)
Total Bilirubin: 0.6 mg/dL (ref 0.3–1.2)
Total Protein: 9.5 g/dL — ABNORMAL HIGH (ref 6.5–8.1)

## 2021-08-29 LAB — URINALYSIS, ROUTINE W REFLEX MICROSCOPIC
Bilirubin Urine: NEGATIVE
Glucose, UA: NEGATIVE mg/dL
Ketones, ur: NEGATIVE mg/dL
Nitrite: NEGATIVE
Protein, ur: NEGATIVE mg/dL
Specific Gravity, Urine: 1.013 (ref 1.005–1.030)
pH: 5 (ref 5.0–8.0)

## 2021-08-29 MED ORDER — CEPHALEXIN 500 MG PO CAPS: 500.0000 mg | ORAL_CAPSULE | Freq: Four times a day (QID) | ORAL | 0 refills | Status: AC

## 2021-08-29 MED ORDER — CEPHALEXIN 500 MG PO CAPS
500.0000 mg | ORAL_CAPSULE | Freq: Once | ORAL | Status: AC
  Administered 2021-08-29: 500 mg via ORAL
  Filled 2021-08-29: qty 1

## 2021-08-29 MED ORDER — CEPHALEXIN 500 MG PO CAPS: 500.0000 mg | ORAL_CAPSULE | Freq: Four times a day (QID) | ORAL | 0 refills | Status: DC

## 2021-08-29 MED ORDER — SODIUM CHLORIDE 0.9 % IV BOLUS
1000.0000 mL | Freq: Once | INTRAVENOUS | Status: AC
  Administered 2021-08-29: 1000 mL via INTRAVENOUS

## 2021-08-29 NOTE — ED Notes (Signed)
See triage note. ? ?Pt denies any complaints to this RN at this time. ? ? ?Pt presented with stool in brief, pt cleaned up, new brief, purewick and chux placed under pt.  ? ?This RN attempted PIV x1 with no success.  ?

## 2021-08-29 NOTE — ED Notes (Signed)
Report to Reggie at [facility] ?

## 2021-08-29 NOTE — ED Provider Notes (Signed)
? ?[facility] ?Provider Note ? ? ? Event Date/Time  ? First MD Initiated Contact with Patient 08/29/21 0700   ?  (approximate) ? ? ?History  ? ?Altered Mental Status ? ? ?HPI ? ?[name] is a 88 y.o. female with past medical history of GERD, dementia, hypertension, anemia, seizure disorder who presents with concern for altered mental status.  Per EMS at her nursing facility they were concerned that she was not out of bed at a normal time for her was not up and walking around.  Apparently otherwise she is at her baseline in terms of her mental status.  Patient denies complaints currently but exam is somewhat limited by her dementia.  She denies chest pain shortness of breath abdominal pain or urinary symptoms.  Tells me that she walks with a walker. ?  ? ?Past Medical History:  ?Diagnosis Date  ? Anemia   ? Dementia (HCC)   ? Depression   ? GERD (gastroesophageal reflux disease)   ? Hypertension   ? Leukopenia   ? chronic  ? Memory change   ? Palpitations   ? Respiratory difficulty   ? h/o failure  ? Seizures (HCC)   ? ? ?Patient Active Problem List  ? Diagnosis Date Noted  ? Cellulitis of right arm 11/02/2019  ? Acute confusion due to infection 11/02/2019  ? Dementia with behavioral disturbance (HCC) 11/02/2019  ? HTN (hypertension) 11/02/2019  ? Seizure disorder (HCC) 11/02/2019  ? Frequent falls 11/02/2019  ? ? ? ?Physical Exam  ?Triage Vital Signs: ?ED Triage Vitals  ?Enc Vitals Group  ?   BP 08/29/21 0707 133/70  ?   Pulse Rate 08/29/21 0707 65  ?   Resp 08/29/21 0707 16  ?   Temp 08/29/21 0707 (!) 97.5 ?F (36.4 ?C)  ?   Temp Source 08/29/21 0707 Axillary  ?   SpO2 08/29/21 0707 99 %  ?   Weight 08/29/21 0705 135 lb (61.2 kg)  ?   Height --   ?   Head Circumference --   ?   Peak Flow --   ?   Pain Score 08/29/21 0705 0  ?   Pain Loc --   ?   Pain Edu? --   ?   Excl. in GC? --   ? ? ?Most recent vital signs: ?Vitals:  ? 08/29/21 1134 08/29/21 1303  ?BP: (!) 168/85 (!) 158/78  ?Pulse:  70 66  ?Resp: 19 20  ?Temp:    ?SpO2: 97% 98%  ? ? ? ?General: Awake, no distress.  ?CV:  Good peripheral perfusion. No edema ?Resp:  Normal effort. No increased WOB ?Abd:  No distention. Soft, nontender  ?Neuro:             Awake, Alert, oriented to person only ?Other:  Moves all extremities symmetrically, face is symmetric, pupiles 42mm BL and symmetrically reactive  ? ? ?ED Results / Procedures / Treatments  ?Labs ?(all labs ordered are listed, but only abnormal results are displayed) ?Labs Reviewed  ?COMPREHENSIVE METABOLIC PANEL - Abnormal; Notable for the following components:  ?    Result Value  ? Glucose, Bld 128 (*)   ? BUN 30 (*)   ? Creatinine, Ser 1.29 (*)   ? Total Protein 9.5 (*)   ? GFR, Estimated 40 (*)   ? All other components within normal limits  ?CBC WITH DIFFERENTIAL/PLATELET - Abnormal; Notable for the following components:  ? RBC 5.52 (*)   ?  MCV 76.4 (*)   ? MCH 22.1 (*)   ? MCHC 28.9 (*)   ? All other components within normal limits  ?URINALYSIS, ROUTINE W REFLEX MICROSCOPIC - Abnormal; Notable for the following components:  ? Color, Urine YELLOW (*)   ? APPearance CLOUDY (*)   ? Hgb urine dipstick SMALL (*)   ? Leukocytes,Ua SMALL (*)   ? Bacteria, UA MANY (*)   ? All other components within normal limits  ?URINE CULTURE  ? ? ? ?EKG ? ?EKG interpreted by myself, normal sinus rhythm with low voltage poor R wave progression, no acute ischemic changes ? ? ?RADIOLOGY ?I reviewed the CXR which does not show any acute cardiopulmonary process; agree with radiology report  ? ? ? ?PROCEDURES: ? ?Critical Care performed: No ? ?Procedures ? ?The patient is on the cardiac monitor to evaluate for evidence of arrhythmia and/or significant heart rate changes. ? ? ?MEDICATIONS ORDERED IN ED: ?Medications  ?sodium chloride 0.9 % bolus 1,000 mL (1,000 mLs Intravenous Bolus 08/29/21 0829)  ?cephALEXin (KEFLEX) capsule 500 mg (500 mg Oral Given 08/29/21 1151)  ? ? ? ?IMPRESSION / MDM / ASSESSMENT AND PLAN / ED  COURSE  ?I reviewed the triage vital signs and the nursing notes. ?             ?               ? ?Differential diagnosis includes, but is not limited to, CVA, infection due to UTI pneumonia viral illness, metabolic abnormality ? ?Patient is an 88 year old female history of dementia and seizure disorder presents with concern for altered mental status.  The story from EMS was that she was not up and walking as much is normal today.  Patient is alert and oriented to person only.  She has a nonfocal neurologic exam moves all 4 extremities symmetrically.  No complaints abdomen is soft and nontender no signs of skin infection.  Vital signs are all within normal limits.  We will send for basic screening labs and a UA and chest x-ray.  Overall I suspect that she is not quite far from baseline. ? ?She has no leukocytosis.  Does have mild AKI creatinine 1.3 from baseline around 0.8.  She was given a liter of fluids for this.  Cath UA was obtained which shows many bacteria 11-20 WBCs.  Nitrite negative.  Patient denies urinary symptoms unclear if this represents asymptomatic bacteriuria or UTI but given the reports of her being off of her baseline I will opt to treat.  However she is not showing signs of sepsis with stable vitals I think she is appropriate for outpatient treatment.  Will discharge with 7 days of Keflex. ?  ? ? ?FINAL CLINICAL IMPRESSION(S) / ED DIAGNOSES  ? ?Final diagnoses:  ?Altered mental status, unspecified altered mental status type  ? ? ? ?Rx / DC Orders  ? ?ED Discharge Orders   ? ?      Ordered  ?  cephALEXin (KEFLEX) 500 MG capsule  4 times daily,   Status:  Discontinued       ? 08/29/21 1142  ?  cephALEXin (KEFLEX) 500 MG capsule  4 times daily       ? 08/29/21 1142  ? ?  ?  ? ?  ? ? ? ?Note:  This document was prepared using Dragon voice recognition software and may include unintentional dictation errors. ?  ?Georga Hacking, MD ?08/29/21 1324 ? ?

## 2021-08-29 NOTE — ED Notes (Signed)
Pt has very poor peripheral vasculature. U/S IV trained RN on way to place IV for labs etc. ?

## 2021-08-29 NOTE — ED Notes (Signed)
Report to EMS. VSS. NAD noted. D/C discussed with WOM ?

## 2021-08-29 NOTE — ED Triage Notes (Signed)
Pt presents to ER via ems from [facility] d/t ams per staff.  Per staff at facility, pt normally is up, walking around and talking with other residents, but today was not.  Pt with hx of dementia and seizures.  No known seizure activity today.  Pt did have some vomit in the area, but staff states they did not\ know if it was hers or not.  Pt alert to self at this time and in NAD.  Pt at her normal orientation at this time.  Cbg 145 w/ems.   ?

## 2021-08-29 NOTE — Discharge Instructions (Signed)
Your blood work was overall reassuring as was the CAT scan of your head.  You may have a urinary tract infection which could be contributing to your weakness.  Please take the antibiotic 4 times a day for the next 7 days.  Return to the emergency department if symptoms are worsening. ?

## 2021-09-01 LAB — URINE CULTURE: Culture: >=100000 — AB

## 2022-04-07 IMAGING — CT CT HEAD W/O CM
4 of 5 series · 16 of 47 positions shown, 18 images · non-contrast
Comparison: 06/05/2011

CLINICAL DATA: Dementia

EXAM:
CT HEAD WITHOUT CONTRAST
TECHNIQUE: Contiguous axial images were obtained from the base of the skull
through the vertex without intravenous contrast.

[Series 2: head bone · axial · 0.42mm/px · z∈[+848,+892]mm · 4 of 72 slices shown]
[im 8/72  bone]
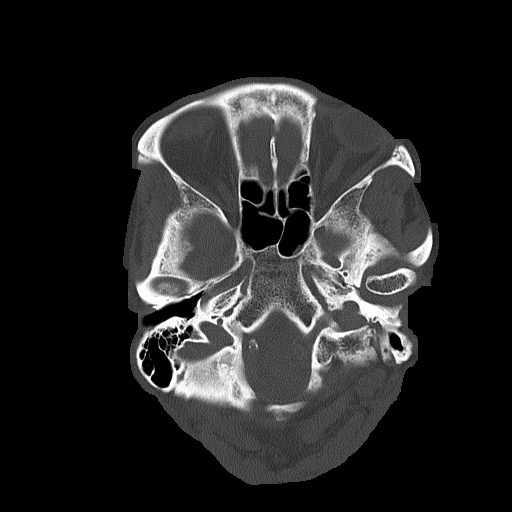
[im 15/72  bone]
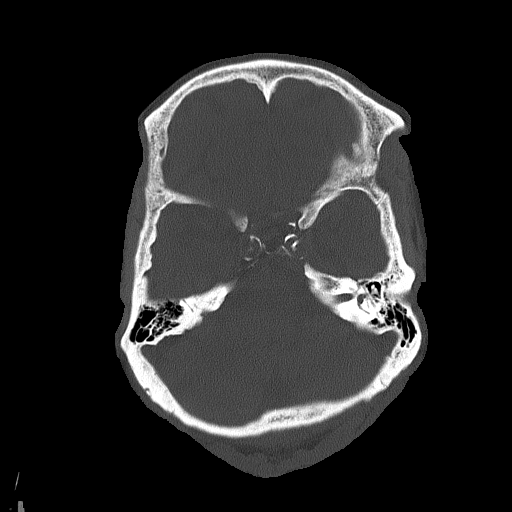
[im 23/72  bone]
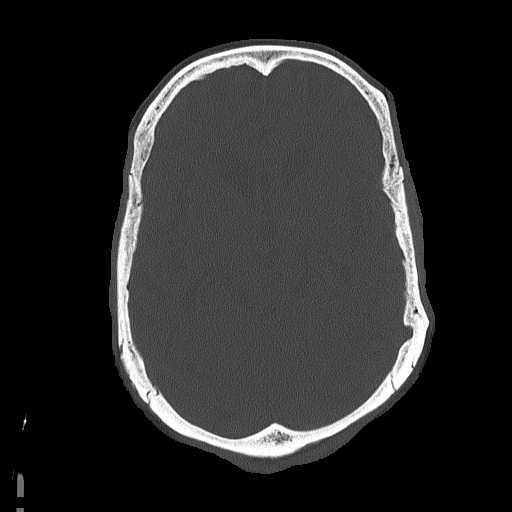
[im 30/72  bone]
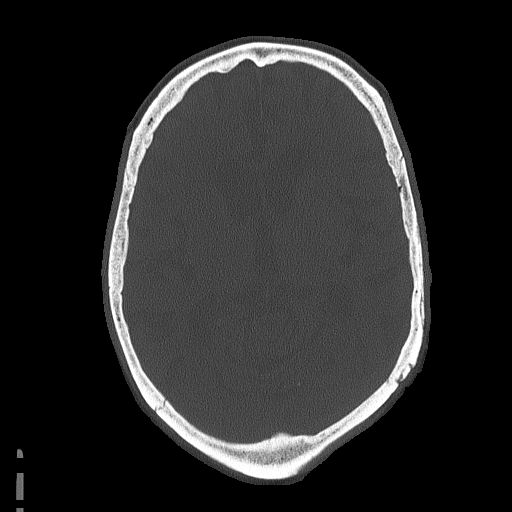

[Series 3: head wo · axial · 0.42mm/px · z∈[+854,+954]mm · 6 of 29 slices shown, 8 images]
[im 5/29  brain]
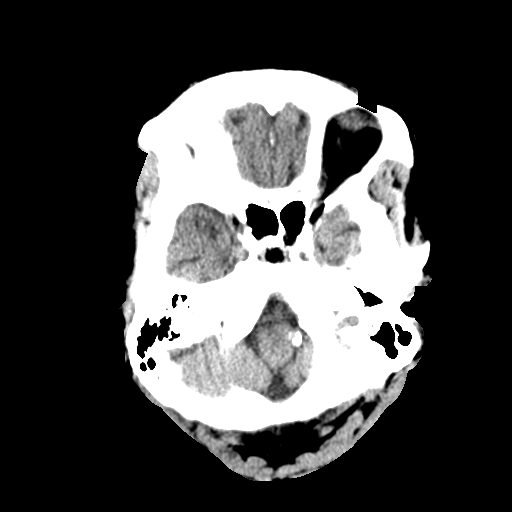
[im 5/29  bone]
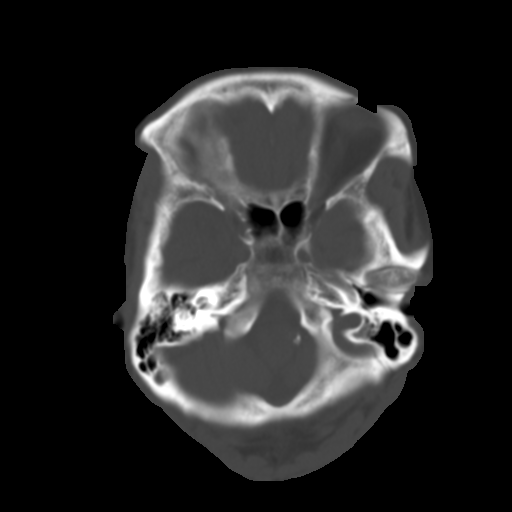
[im 9/29  brain]
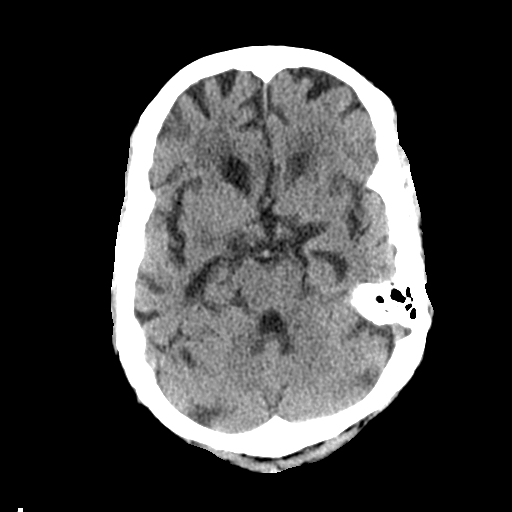
[im 13/29  brain]
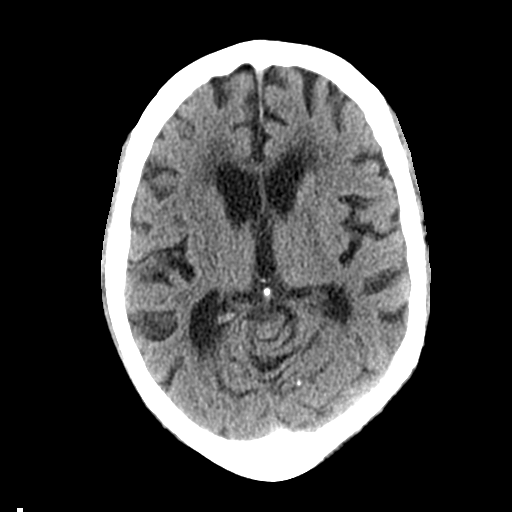
[im 17/29  brain]
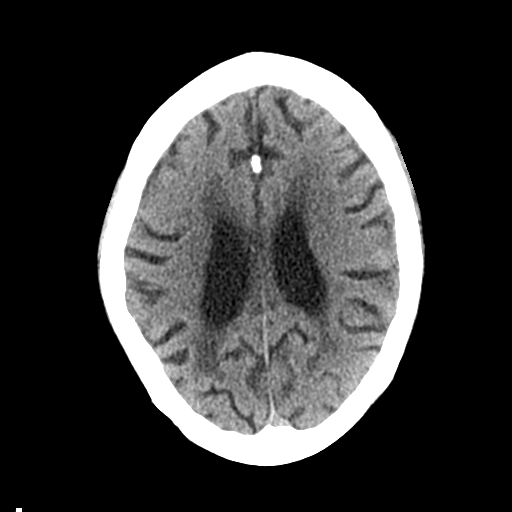
[im 21/29  brain]
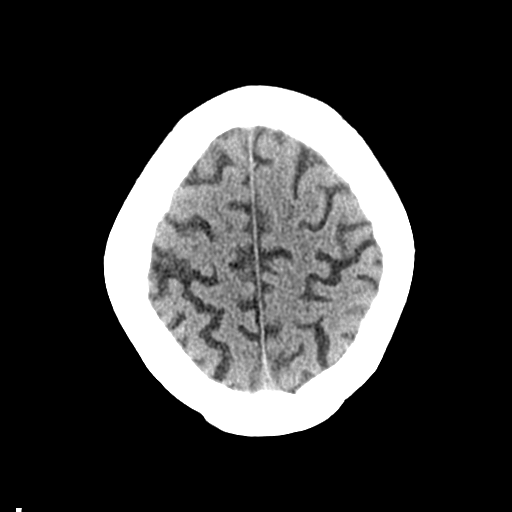
[im 21/29  bone]
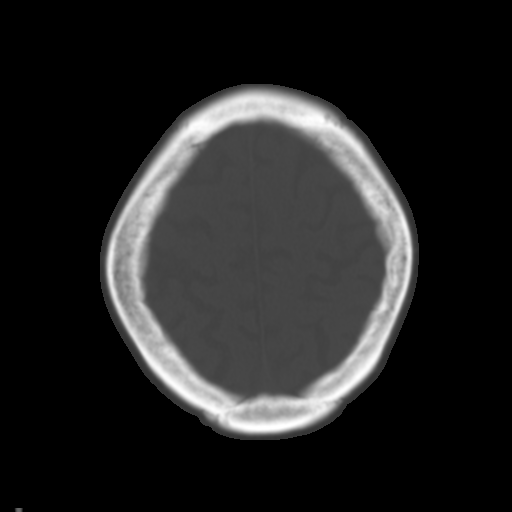
[im 25/29  brain]
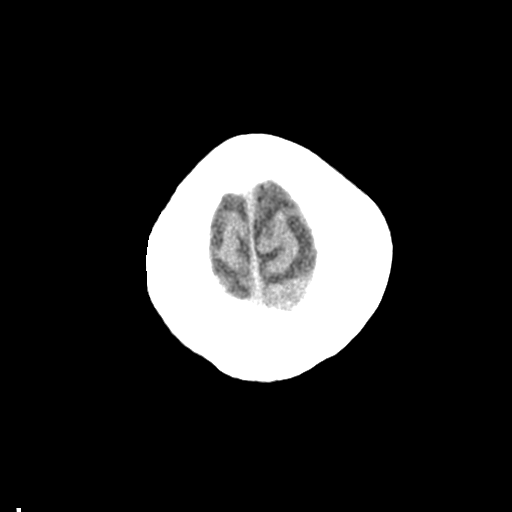

[Series 4: coronal soft tissue · coronal · 0.31mm/px · 3 of 67 slices shown]
[im 23/67  brain]
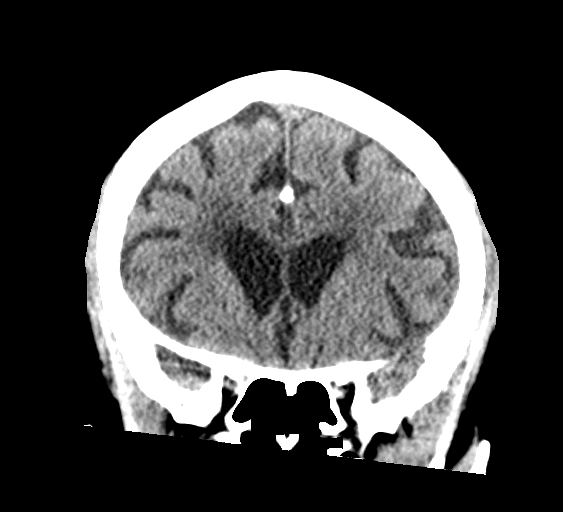
[im 30/67  brain]
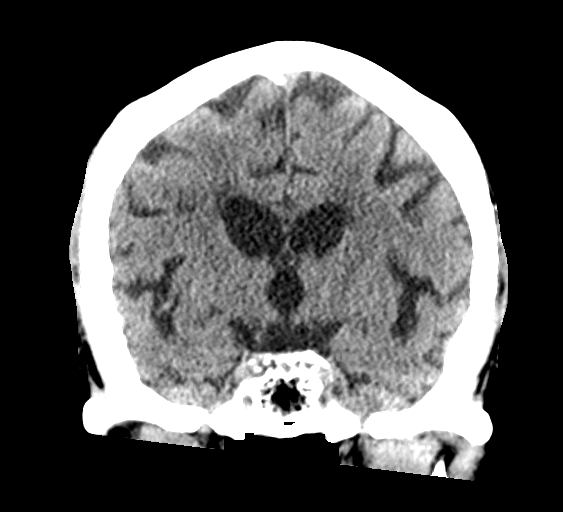
[im 37/67  brain]
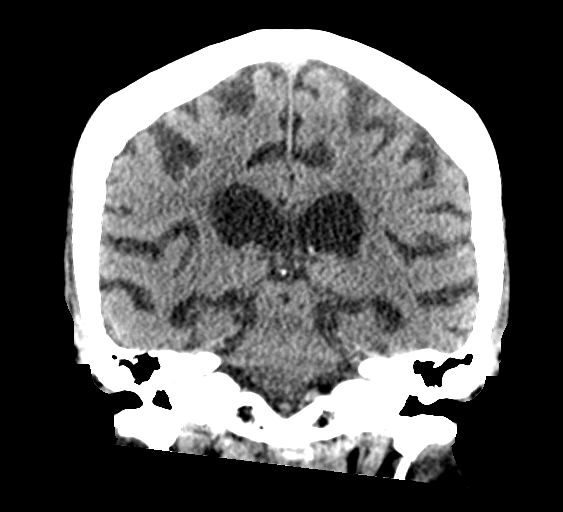

[Series 5: sagittal soft tissue · sagittal · 0.32mm/px · 3 of 51 slices shown]
[im 17/51  brain]
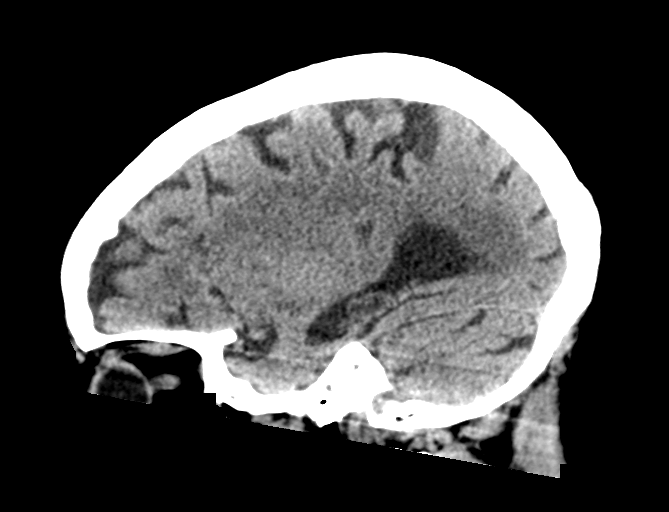
[im 26/51  brain]
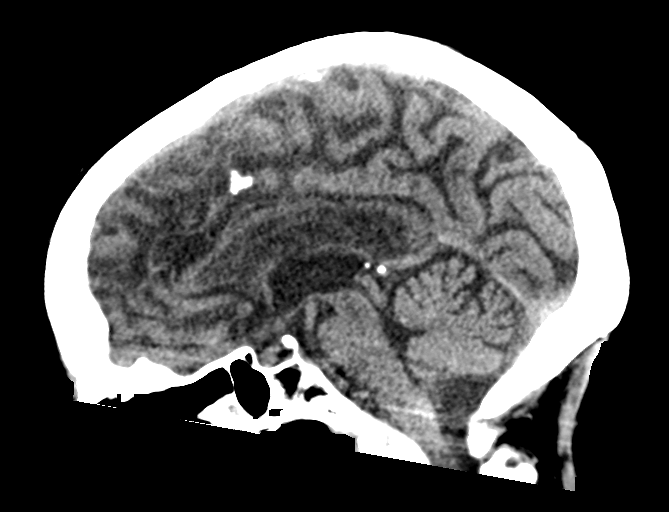
[im 34/51  brain]
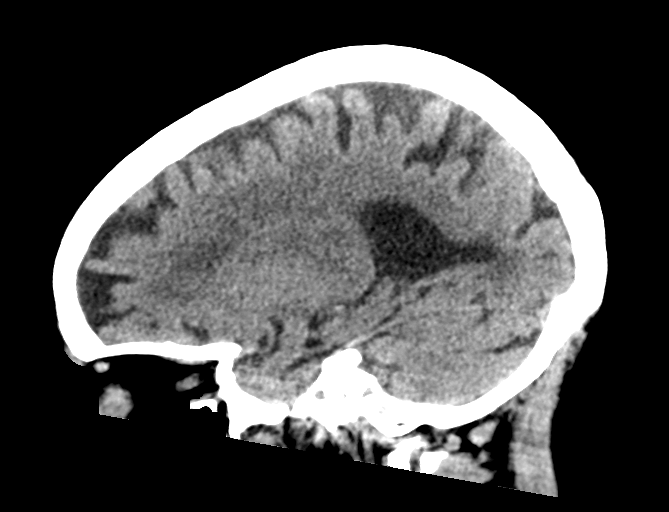

[16 of 47 positions shown; findings below may reference images not displayed]

FINDINGS: Brain: There is atrophy and chronic small vessel disease changes.
Small vessel disease may have progressed since prior study. No acute
intracranial abnormality. Specifically, no hemorrhage,
hydrocephalus, mass lesion, acute infarction, or significant
intracranial injury.

Vascular: No hyperdense vessel or unexpected calcification.

Skull: No acute calvarial abnormality.

Sinuses/Orbits: Visualized paranasal sinuses and mastoids clear.
Orbital soft tissues unremarkable.

Other: None
IMPRESSION: Atrophy, chronic microvascular disease, progressive since prior
study.

No acute intracranial abnormality.

## 2023-09-23 ENCOUNTER — Emergency Department: Payer: Medicare (Managed Care)

## 2023-09-23 ENCOUNTER — Observation Stay
Admission: EM | Admit: 2023-09-23 | Discharge: 2023-09-24 | Disposition: A | Discharge status: Skilled Nursing Facility | Payer: Medicare (Managed Care) | Attending: Internal Medicine | Admitting: Internal Medicine

## 2023-09-23 ENCOUNTER — Other Ambulatory Visit: Payer: Self-pay

## 2023-09-23 ENCOUNTER — Emergency Department
Admission: EM | Admit: 2023-09-23 | Discharge: 2023-09-23 | Disposition: A | Discharge status: Home / Self Care | Payer: Medicare (Managed Care) | Source: Home / Self Care | Attending: Emergency Medicine | Admitting: Emergency Medicine

## 2023-09-23 DIAGNOSIS — M545 Low back pain, unspecified: Secondary | ICD-10-CM | POA: Insufficient documentation

## 2023-09-23 DIAGNOSIS — R404 Transient alteration of awareness: Secondary | ICD-10-CM | POA: Diagnosis present

## 2023-09-23 DIAGNOSIS — I10 Essential (primary) hypertension: Secondary | ICD-10-CM | POA: Insufficient documentation

## 2023-09-23 DIAGNOSIS — R55 Syncope and collapse: Secondary | ICD-10-CM | POA: Diagnosis present

## 2023-09-23 DIAGNOSIS — R296 Repeated falls: Secondary | ICD-10-CM | POA: Insufficient documentation

## 2023-09-23 DIAGNOSIS — Z79899 Other long term (current) drug therapy: Secondary | ICD-10-CM | POA: Diagnosis not present

## 2023-09-23 DIAGNOSIS — R4189 Other symptoms and signs involving cognitive functions and awareness: Principal | ICD-10-CM

## 2023-09-23 DIAGNOSIS — R109 Unspecified abdominal pain: Secondary | ICD-10-CM | POA: Diagnosis not present

## 2023-09-23 DIAGNOSIS — R1110 Vomiting, unspecified: Secondary | ICD-10-CM | POA: Insufficient documentation

## 2023-09-23 DIAGNOSIS — Z9181 History of falling: Secondary | ICD-10-CM | POA: Diagnosis not present

## 2023-09-23 DIAGNOSIS — F0280 Dementia in other diseases classified elsewhere without behavioral disturbance: Secondary | ICD-10-CM | POA: Insufficient documentation

## 2023-09-23 DIAGNOSIS — G40909 Epilepsy, unspecified, not intractable, without status epilepticus: Secondary | ICD-10-CM | POA: Diagnosis not present

## 2023-09-23 DIAGNOSIS — W19XXXA Unspecified fall, initial encounter: Secondary | ICD-10-CM | POA: Insufficient documentation

## 2023-09-23 DIAGNOSIS — R1111 Vomiting without nausea: Secondary | ICD-10-CM

## 2023-09-23 DIAGNOSIS — F0390 Unspecified dementia without behavioral disturbance: Secondary | ICD-10-CM | POA: Diagnosis present

## 2023-09-23 DIAGNOSIS — M549 Dorsalgia, unspecified: Secondary | ICD-10-CM | POA: Insufficient documentation

## 2023-09-23 DIAGNOSIS — I959 Hypotension, unspecified: Secondary | ICD-10-CM

## 2023-09-23 DIAGNOSIS — G309 Alzheimer's disease, unspecified: Secondary | ICD-10-CM | POA: Insufficient documentation

## 2023-09-23 LAB — COMPREHENSIVE METABOLIC PANEL WITH GFR
ALT: 12 U/L (ref 0–44)
AST: 22 U/L (ref 15–41)
Albumin: 3.9 g/dL (ref 3.5–5.0)
Alkaline Phosphatase: 41 U/L (ref 38–126)
Anion gap: 9 (ref 5–15)
BUN: 24 mg/dL — ABNORMAL HIGH (ref 8–23)
CO2: 25 mmol/L (ref 22–32)
Calcium: 9.7 mg/dL (ref 8.9–10.3)
Chloride: 105 mmol/L (ref 98–111)
Creatinine, Ser: 1.01 mg/dL — ABNORMAL HIGH (ref 0.44–1.00)
GFR, Estimated: 53 mL/min — ABNORMAL LOW (ref >60)
Glucose, Bld: 99 mg/dL (ref 70–99)
Potassium: 4.3 mmol/L (ref 3.5–5.1)
Sodium: 139 mmol/L (ref 135–145)
Total Bilirubin: 0.5 mg/dL (ref 0.0–1.2)
Total Protein: 7.7 g/dL (ref 6.5–8.1)

## 2023-09-23 LAB — CBC
HCT: 40 % (ref 36.0–46.0)
Hemoglobin: 11.6 g/dL — ABNORMAL LOW (ref 12.0–15.0)
MCH: 23 pg — ABNORMAL LOW (ref 26.0–34.0)
MCHC: 29 g/dL — ABNORMAL LOW (ref 30.0–36.0)
MCV: 79.4 fL — ABNORMAL LOW (ref 80.0–100.0)
Platelets: 92 10*3/uL — ABNORMAL LOW (ref 150–400)
RBC: 5.04 MIL/uL (ref 3.87–5.11)
RDW: 14.7 % (ref 11.5–15.5)
WBC: 4.2 10*3/uL (ref 4.0–10.5)
nRBC: 0 % (ref 0.0–0.2)

## 2023-09-23 LAB — URINALYSIS, ROUTINE W REFLEX MICROSCOPIC
Bilirubin Urine: NEGATIVE
Glucose, UA: NEGATIVE mg/dL
Hgb urine dipstick: NEGATIVE
Ketones, ur: NEGATIVE mg/dL
Leukocytes,Ua: NEGATIVE
Nitrite: NEGATIVE
Protein, ur: NEGATIVE mg/dL
Specific Gravity, Urine: 1.011 (ref 1.005–1.030)
pH: 5 (ref 5.0–8.0)

## 2023-09-23 LAB — TROPONIN I (HIGH SENSITIVITY): Troponin I (High Sensitivity): 3 ng/L (ref <18)

## 2023-09-23 LAB — LIPASE, BLOOD: Lipase: 58 U/L — ABNORMAL HIGH (ref 11–51)

## 2023-09-23 MED ORDER — SODIUM CHLORIDE 0.9 % IV BOLUS
500.0000 mL | Freq: Once | INTRAVENOUS | Status: AC
  Administered 2023-09-24: 500 mL via INTRAVENOUS

## 2023-09-23 MED ORDER — IOHEXOL 350 MG/ML SOLN
100.0000 mL | Freq: Once | INTRAVENOUS | Status: AC | PRN
  Administered 2023-09-23: 80 mL via INTRAVENOUS

## 2023-09-23 MED ORDER — ONDANSETRON 4 MG PO TBDP: 4.0000 mg | ORAL_TABLET | Freq: Three times a day (TID) | ORAL | 0 refills | Status: AC | PRN

## 2023-09-23 MED ORDER — IOHEXOL 300 MG/ML  SOLN: 100.0000 mL | Freq: Once | INTRAMUSCULAR | Status: DC | PRN

## 2023-09-23 NOTE — ED Notes (Signed)
 Attempted to call report, no answer will re-attempt.

## 2023-09-23 NOTE — ED Notes (Signed)
 [facility] at bedside.

## 2023-09-23 NOTE — ED Triage Notes (Signed)
 Pt brought in by EMS for syncopal episode.  Pt was seen here earlier today for abdominal pain, but d/c' d back to SNF.  Pt was found on the ground by staff at SNF, unresponsive, GCS 3, BP 60s/40s with agonal breathing.  EMS reports that once pt was placed on stretcher, symptoms had resolved and VSS.  Pt is now back to her baseline mentation.

## 2023-09-23 NOTE — ED Notes (Signed)
 This tech assisted pt to the bathroom. Pt urinated in toilet. Pt was assisted back into the chair. Sitter remains at bedside.

## 2023-09-23 NOTE — ED Provider Notes (Signed)
 [facility] Provider Note    Event Date/Time   First MD Initiated Contact with Patient 09/23/23 1302     (approximate)   History   Abdominal Pain and Back Pain   HPI  Tammy Clayton is a 89 y.o. female who presents to the ED for evaluation of Abdominal Pain and Back Pain   Review medical DC summary from 2021.  History of Alzheimer's dementia, HTN and seizure disorder.  Patient presented to the ED asymptomatic.  Reportedly this morning was complaining of abdominal and back pain and recurrent emesis.  Here in the ED she has no complaints.  She is asking to go back to her facility.  History is limited due to this   Physical Exam   Triage Vital Signs: ED Triage Vitals  Encounter Vitals Group     BP 09/23/23 1205 (!) 177/82     Systolic BP Percentile --      Diastolic BP Percentile --      Pulse Rate 09/23/23 1205 61     Resp 09/23/23 1205 17     Temp 09/23/23 1205 (!) 97.5 F (36.4 C)     Temp Source 09/23/23 1205 Oral     SpO2 09/23/23 1205 100 %     Weight 09/23/23 1116 130 lb (59 kg)     Height 09/23/23 1116 5\' 2"  (1.575 m)     Head Circumference --      Peak Flow --      Pain Score 09/23/23 1115 6     Pain Loc --      Pain Education --      Exclude from Growth Chart --     Most recent vital signs: Vitals:   09/23/23 1400 09/23/23 1500  BP: (!) 174/81 (!) 155/100  Pulse: (!) 53 64  Resp: 19 14  Temp:    SpO2: 96% 95%    General: Awake, no distress.  CV:  Good peripheral perfusion.  Resp:  Normal effort.  Abd:  No distention.  Mild diffuse tenderness without guarding or peritoneal features. MSK:  No deformity noted.  Neuro:  No focal deficits appreciated. Other:     ED Results / Procedures / Treatments   Labs (all labs ordered are listed, but only abnormal results are displayed) Labs Reviewed  LIPASE, BLOOD - Abnormal; Notable for the following components:      Result Value   Lipase 58 (*)    All other components within  normal limits  COMPREHENSIVE METABOLIC PANEL WITH GFR - Abnormal; Notable for the following components:   BUN 24 (*)    Creatinine, Ser 1.01 (*)    GFR, Estimated 53 (*)    All other components within normal limits  CBC - Abnormal; Notable for the following components:   Hemoglobin 11.6 (*)    MCV 79.4 (*)    MCH 23.0 (*)    MCHC 29.0 (*)    Platelets 92 (*)    All other components within normal limits  URINALYSIS, ROUTINE W REFLEX MICROSCOPIC - Abnormal; Notable for the following components:   Color, Urine YELLOW (*)    APPearance CLOUDY (*)    All other components within normal limits  TROPONIN I (HIGH SENSITIVITY)  TROPONIN I (HIGH SENSITIVITY)    EKG Sinus rhythm with a rate of 62 bpm.  Leftward axis, normal intervals and no clear signs of acute ischemia.  RADIOLOGY CT pending  Official radiology report(s): No results found.  PROCEDURES and INTERVENTIONS:  .  1-3 Lead EKG Interpretation  Performed by: Arline Bennett, MD Authorized by: Arline Bennett, MD     Interpretation: normal     ECG rate:  68   ECG rate assessment: normal     Rhythm: sinus rhythm     Ectopy: none     Conduction: normal     Medications  iohexol (OMNIPAQUE) 350 MG/ML injection 100 mL (100 mLs Intravenous Contrast Given 09/23/23 1501)     IMPRESSION / MDM / ASSESSMENT AND PLAN / ED COURSE  I reviewed the triage vital signs and the nursing notes.  Differential diagnosis includes, but is not limited to, dehydration, MSK pain, cardiac dysrhythmia, seizure disorder, aortic emergency, sepsis,  {Patient presents with symptoms of an acute illness or injury that is potentially life-threatening.  Patient presents to the ED with resolving symptoms.  Reportedly recurrently vomiting and complaining of lower back and abdominal pain.  With me she has no complaints but does have some mild abdominal tenderness.  Hypertension is noted, otherwise reassuring vital signs.  Nonischemic EKG, normal metabolic panel,  CBC and urine.  Will obtain a CT.  She is signed out to oncoming physician to follow-up in this imaging study.  If this is reassuring she may be suitable for outpatient management.  Clinical Course as of 09/23/23 1513  Fri Sep 23, 2023  1452 USIV, right basilic v [DS]    Clinical Course User Index [DS] Arline Bennett, MD     FINAL CLINICAL IMPRESSION(S) / ED DIAGNOSES   Final diagnoses:  None     Rx / DC Orders   ED Discharge Orders     None        Note:  This document was prepared using Dragon voice recognition software and may include unintentional dictation errors.   Arline Bennett, MD 09/23/23 [phone]

## 2023-09-23 NOTE — ED Notes (Signed)
 Pts Pace MD called and requested pt have trop and EKG completed prior to returning back to nursing facility.

## 2023-09-23 NOTE — ED Triage Notes (Signed)
 Pt comes via EMs from [facility] with c/o not acting right and pt was vomiting. Pt states she is cold.   Pt states lower pain in her back. Pt answering all questions appropriately,.

## 2023-09-23 NOTE — ED Triage Notes (Signed)
 FIRST RN: pt from [facility] via ems with reports from staff that pt wasn't acting normal self this am and vomited x 7. Per EMS pt at baseline with dementia.   141CBG 122/76 60 HR 98 RA

## 2023-09-23 NOTE — ED Notes (Signed)
 This RN received phone call from [facility] physician and received following information:  [facility] phone number for collateral and additional information regarding meds: [phone].  Per [facility] physician pt with significant PMH of vascular dementia, seizure disorder and HTN.

## 2023-09-23 NOTE — ED Provider Notes (Signed)
 [facility] Provider Note    Event Date/Time   First MD Initiated Contact with Patient 09/23/23 2313     (approximate)  History   Chief Complaint: Near Syncope (Pt brought in by EMS for syncopal episode.  Pt was seen here earlier today for abdominal pain, but d/c' d back to SNF.  Pt was found on the ground by staff at SNF, unresponsive, GCS 3, BP 60s/40s with agonal breathing.  EMS reports that once pt was placed on stretcher, symptoms had resolved and VSS.  Pt is now back to her baseline mentation.)  HPI  Sahirah Rudell is a 89 y.o. female with a past medical history of dementia, anemia, gastric reflux, seizures, presents to the emergency department for a likely syncopal episode.  Patient was seen in the emergency department earlier today complaining of abdominal pain coming from her care facility.  Patient had an extensive workup which I reviewed including a CT scan of the chest abdomen and pelvis that did not show any significant finding.  Patient ultimately was discharged back to her care facility.  This evening at the care facility patient was found on the ground by staff unresponsive they reported GCS of 3 blood pressure of 60/40 with agonal type respirations.  EMS states similar appearance upon their arrival however the patient improved once placed on the stretcher.  Upon arrival to the emergency department patient appears to be largely at her reported baseline she will attempt to answer some questions although seems to be an accurate responses but has a history of dementia.  Physical Exam   Triage Vital Signs: ED Triage Vitals  Encounter Vitals Group     BP 09/23/23 2318 (!) 154/82     Systolic BP Percentile --      Diastolic BP Percentile --      Pulse Rate 09/23/23 2318 63     Resp 09/23/23 2327 16     Temp 09/23/23 2318 (!) 97.5 F (36.4 C)     Temp Source 09/23/23 2318 Oral     SpO2 09/23/23 2327 100 %     Weight 09/23/23 2320 130 lb 1.1 oz (59 kg)      Height 09/23/23 2320 5\' 2"  (1.575 m)     Head Circumference --      Peak Flow --      Pain Score 09/23/23 2320 0     Pain Loc --      Pain Education --      Exclude from Growth Chart --     Most recent vital signs: Vitals:   09/23/23 2318 09/23/23 2327  BP: (!) 154/82   Pulse: 63   Resp:  16  Temp: (!) 97.5 F (36.4 C)   SpO2:  100%    General: Awake, no distress.  Patient with baseline dementia but attempting to answer questions appropriately. CV:  Good peripheral perfusion.  Regular rate and rhythm  Resp:  Normal effort.  Equal breath sounds bilaterally.  Abd:  No distention.  Soft, nontender.  No rebound or guarding.   ED Results / Procedures / Treatments   EKG  EKG viewed and interpreted by myself shows a sinus rhythm at 67 bpm with a narrow QRS, normal axis, normal intervals, no concerning ST changes.  RADIOLOGY  I have reviewed and interpreted the CT head images.  No large bleed seen on my evaluation. Radiology is read the CT scan as negative.   MEDICATIONS ORDERED IN ED: Medications  sodium chloride   0.9 % bolus 500 mL (has no administration in time range)     IMPRESSION / MDM / ASSESSMENT AND PLAN / ED COURSE  I reviewed the triage vital signs and the nursing notes.  Patient's presentation is most consistent with acute presentation with potential threat to life or bodily function.  Patient presents the emergency department after being found on the ground unresponsive at her nursing facility.  EMS states initial blood pressure 60/40 unresponsive GCS of 3 was placed on the stretcher and the patient became more alert by the time the patient arrived to the emergency department patient was back to her reported baseline.  Patient is unable to provide any meaningful history.  It is unclear the cause of the event this evening.  Differential would include syncope, arrhythmia, seizure, MI/ACS, CVA or head bleed, among others.  Will obtain a CT scan of the head given  the unwitnessed fall.  She does not appear to have any acute complaints at this time.  We will recheck labs including cardiac enzymes.  Patient did reassuringly have a negative CTA of the chest abdomen and pelvis earlier today.  However given her concerning unresponsive episode with significant hypotension I believe the patient will ultimately require admission to the hospitalist for continued monitoring to ensure that the patient is safe to return back to her care facility.  CT scans are negative.  Patient's CBC reassuring, chemistry reassuring, troponin negative x 2.  Respiratory panel negative.  However given the patient's concerning episode of unresponsiveness and initial hypotension we will admit to the hospital service for further monitoring.  FINAL CLINICAL IMPRESSION(S) / ED DIAGNOSES   Unresponsiveness Fall  Note:  This document was prepared using Dragon voice recognition software and may include unintentional dictation errors.   Ruth Cove, MD 09/24/23 0600

## 2023-09-24 ENCOUNTER — Observation Stay
Admit: 2023-09-24 | Discharge: 2023-09-24 | Disposition: A | Discharge status: Home / Self Care | Payer: Medicare (Managed Care) | Attending: Internal Medicine | Admitting: Internal Medicine

## 2023-09-24 ENCOUNTER — Emergency Department: Payer: Medicare (Managed Care)

## 2023-09-24 DIAGNOSIS — R404 Transient alteration of awareness: Secondary | ICD-10-CM | POA: Diagnosis not present

## 2023-09-24 DIAGNOSIS — I10 Essential (primary) hypertension: Secondary | ICD-10-CM

## 2023-09-24 DIAGNOSIS — R55 Syncope and collapse: Secondary | ICD-10-CM | POA: Diagnosis not present

## 2023-09-24 DIAGNOSIS — I959 Hypotension, unspecified: Secondary | ICD-10-CM

## 2023-09-24 DIAGNOSIS — F0390 Unspecified dementia without behavioral disturbance: Secondary | ICD-10-CM

## 2023-09-24 DIAGNOSIS — M549 Dorsalgia, unspecified: Secondary | ICD-10-CM | POA: Insufficient documentation

## 2023-09-24 DIAGNOSIS — G40909 Epilepsy, unspecified, not intractable, without status epilepticus: Secondary | ICD-10-CM

## 2023-09-24 LAB — RESP PANEL BY RT-PCR (RSV, FLU A&B, COVID)  RVPGX2
Influenza A by PCR: NEGATIVE
Influenza B by PCR: NEGATIVE
Resp Syncytial Virus by PCR: NEGATIVE
SARS Coronavirus 2 by RT PCR: NEGATIVE

## 2023-09-24 LAB — COMPREHENSIVE METABOLIC PANEL WITH GFR
ALT: 13 U/L (ref 0–44)
AST: 24 U/L (ref 15–41)
Albumin: 4.7 g/dL (ref 3.5–5.0)
Alkaline Phosphatase: 49 U/L (ref 38–126)
Anion gap: 12 (ref 5–15)
BUN: 20 mg/dL (ref 8–23)
CO2: 24 mmol/L (ref 22–32)
Calcium: 10.2 mg/dL (ref 8.9–10.3)
Chloride: 102 mmol/L (ref 98–111)
Creatinine, Ser: 1.08 mg/dL — ABNORMAL HIGH (ref 0.44–1.00)
GFR, Estimated: 49 mL/min — ABNORMAL LOW (ref >60)
Glucose, Bld: 137 mg/dL — ABNORMAL HIGH (ref 70–99)
Potassium: 3.7 mmol/L (ref 3.5–5.1)
Sodium: 138 mmol/L (ref 135–145)
Total Bilirubin: 0.7 mg/dL (ref 0.0–1.2)
Total Protein: 8.9 g/dL — ABNORMAL HIGH (ref 6.5–8.1)

## 2023-09-24 LAB — CBC
HCT: 43 % (ref 36.0–46.0)
Hemoglobin: 12.8 g/dL (ref 12.0–15.0)
MCH: 23.1 pg — ABNORMAL LOW (ref 26.0–34.0)
MCHC: 29.8 g/dL — ABNORMAL LOW (ref 30.0–36.0)
MCV: 77.5 fL — ABNORMAL LOW (ref 80.0–100.0)
Platelets: 193 10*3/uL (ref 150–400)
RBC: 5.55 MIL/uL — ABNORMAL HIGH (ref 3.87–5.11)
RDW: 14.7 % (ref 11.5–15.5)
WBC: 7.3 10*3/uL (ref 4.0–10.5)
nRBC: 0 % (ref 0.0–0.2)

## 2023-09-24 LAB — TROPONIN I (HIGH SENSITIVITY)
Troponin I (High Sensitivity): 8 ng/L (ref <18)
Troponin I (High Sensitivity): 6 ng/L (ref <18)

## 2023-09-24 LAB — CBG MONITORING, ED: Glucose-Capillary: 88 mg/dL (ref 70–99)

## 2023-09-24 MED ORDER — ACETAMINOPHEN 325 MG PO TABS: 650.0000 mg | ORAL_TABLET | Freq: Four times a day (QID) | ORAL | Status: DC | PRN

## 2023-09-24 MED ORDER — LEVETIRACETAM (KEPPRA) 500 MG/5 ML ADULT IV PUSH
750.0000 mg | Freq: Two times a day (BID) | INTRAVENOUS | Status: DC
  Administered 2023-09-24: 750 mg via INTRAVENOUS
  Filled 2023-09-24: qty 10

## 2023-09-24 MED ORDER — PANTOPRAZOLE SODIUM 40 MG PO TBEC: 40.0000 mg | DELAYED_RELEASE_TABLET | Freq: Every day | ORAL | Status: DC

## 2023-09-24 MED ORDER — ACETAMINOPHEN 325 MG RE SUPP: 650.0000 mg | Freq: Four times a day (QID) | RECTAL | Status: DC | PRN

## 2023-09-24 MED ORDER — MIRTAZAPINE 15 MG PO TABS: 7.5000 mg | ORAL_TABLET | Freq: Every day | ORAL | Status: DC

## 2023-09-24 MED ORDER — VITAMIN D 25 MCG (1000 UNIT) PO TABS: 1000.0000 [IU] | ORAL_TABLET | Freq: Every day | ORAL | Status: DC

## 2023-09-24 MED ORDER — CITALOPRAM HYDROBROMIDE 20 MG PO TABS: 10.0000 mg | ORAL_TABLET | Freq: Every day | ORAL | Status: DC

## 2023-09-24 MED ORDER — LISINOPRIL 10 MG PO TABS: 30.0000 mg | ORAL_TABLET | Freq: Every day | ORAL | Status: DC

## 2023-09-24 MED ORDER — ONDANSETRON HCL 4 MG/2ML IJ SOLN: 4.0000 mg | Freq: Four times a day (QID) | INTRAMUSCULAR | Status: DC | PRN

## 2023-09-24 MED ORDER — ONDANSETRON HCL 4 MG PO TABS: 4.0000 mg | ORAL_TABLET | Freq: Four times a day (QID) | ORAL | Status: DC | PRN

## 2023-09-24 MED ORDER — LEVETIRACETAM 500 MG PO TABS: 500.0000 mg | ORAL_TABLET | Freq: Every morning | ORAL | Status: DC

## 2023-09-24 MED ORDER — SODIUM CHLORIDE 0.9% FLUSH
3.0000 mL | Freq: Two times a day (BID) | INTRAVENOUS | Status: DC
  Administered 2023-09-24: 3 mL via INTRAVENOUS

## 2023-09-24 MED ORDER — POLYETHYLENE GLYCOL 3350 17 G PO PACK: 17.0000 g | PACK | Freq: Every day | ORAL | Status: DC | PRN

## 2023-09-24 MED ORDER — LEVETIRACETAM 750 MG PO TABS
750.0000 mg | ORAL_TABLET | Freq: Every evening | ORAL | Status: DC
  Filled 2023-09-24: qty 1

## 2023-09-24 MED ORDER — ACETAMINOPHEN 650 MG RE SUPP: 650.0000 mg | Freq: Four times a day (QID) | RECTAL | Status: DC | PRN

## 2023-09-24 MED ORDER — HYDRALAZINE HCL 20 MG/ML IJ SOLN: 10.0000 mg | Freq: Four times a day (QID) | INTRAMUSCULAR | Status: DC | PRN

## 2023-09-24 MED ORDER — ENOXAPARIN SODIUM 30 MG/0.3ML IJ SOSY
30.0000 mg | PREFILLED_SYRINGE | INTRAMUSCULAR | Status: DC
  Administered 2023-09-24: 30 mg via SUBCUTANEOUS
  Filled 2023-09-24: qty 0.3

## 2023-09-24 NOTE — ED Notes (Signed)
 CCMD called to initiate cardiac monitoring

## 2023-09-24 NOTE — Progress Notes (Signed)
  Echocardiogram 2D Echocardiogram has been performed.  Tammy Clayton 09/24/2023, 3:36 PM

## 2023-09-24 NOTE — Evaluation (Signed)
 Occupational Therapy Evaluation Patient Details Name: Tammy Clayton MRN: [id number] DOB: Oct 08, 1932 Today's Date: 09/24/2023   History of Present Illness   89 y/o female presented to ED on 09/23/23 from [facility] for syncopal episode. Seen earlier in the day for abdominal pain. Admitted for hypotensive episode and bradycardia. PMH: Alzheimer's dementia, HTN, seizure, frequent falls     Clinical Impressions Pt was seen for OT evaluation this date. Prior to hospital admission, pt was living at [facility] per RN. No caregivers present to verify PLOF. Pt presents to acute OT demonstrating impaired ADL performance and functional mobility 2/2 decreased strength, balance, cognition, and endorsing PRN headaches (See OT problem list for additional functional deficits). Pt currently requires CGA/handheld assist x2 to stand and take a few lateral steps. BP significantly elevated so further mobility deferred. Bed level pericare and brief change completed requiring MAX A. Pt may benefit from skilled OT services to address noted impairments and functional limitations (see below for any additional details) in order to maximize safety and independence while minimizing falls risk and caregiver burden.     If plan is discharge home, recommend the following:   A little help with walking and/or transfers;A lot of help with bathing/dressing/bathroom;Assistance with cooking/housework;Assist for transportation;Direct supervision/assist for medications management;Direct supervision/assist for financial management;Supervision due to cognitive status;Help with stairs or ramp for entrance     Functional Status Assessment   Patient has had a recent decline in their functional status and demonstrates the ability to make significant improvements in function in a reasonable and predictable amount of time.     Equipment Recommendations   Other (comment) (2WW if confirmed she does not already have one)      Recommendations for Other Services         Precautions/Restrictions   Precautions Precautions: Fall Recall of Precautions/Restrictions: Impaired Restrictions Weight Bearing Restrictions Per Provider Order: No     Mobility Bed Mobility Overal bed mobility: Needs Assistance Bed Mobility: Supine to Sit, Sit to Supine     Supine to sit: Supervision, HOB elevated Sit to supine: Min assist        Transfers Overall transfer level: Needs assistance Equipment used: 2 person hand held assist Transfers: Sit to/from Stand Sit to Stand: Contact guard assist                  Balance Overall balance assessment: Needs assistance Sitting-balance support: No upper extremity supported, Feet supported Sitting balance-Leahy Scale: Fair     Standing balance support: Bilateral upper extremity supported Standing balance-Leahy Scale: Poor                             ADL either performed or assessed with clinical judgement   ADL Overall ADL's : Needs assistance/impaired                     Lower Body Dressing: Bed level;Maximal assistance Lower Body Dressing Details (indicate cue type and reason): doff soiled brief, don clean brief, bed level 2/2 elevated BP     Toileting- Clothing Manipulation and Hygiene: Bed level;Maximal assistance Toileting - Clothing Manipulation Details (indicate cue type and reason): rolling side to side, bed level 2/2 elevated BP     Functional mobility during ADLs: Contact guard assist       Vision         Perception         Praxis  Pertinent Vitals/Pain Pain Assessment Pain Assessment: Faces Faces Pain Scale: Hurts a little bit Pain Location: headache Pain Descriptors / Indicators: Headache Pain Intervention(s): Monitored during session, Repositioned     Extremity/Trunk Assessment Upper Extremity Assessment Upper Extremity Assessment: Generalized weakness   Lower Extremity Assessment Lower  Extremity Assessment: Generalized weakness       Communication Communication Communication: No apparent difficulties   Cognition Arousal: Alert Behavior During Therapy: WFL for tasks assessed/performed Cognition: History of cognitive impairments             OT - Cognition Comments: pt pleasant, able to follow simple commands fairly well with cues, decr safety awareness, impaired short term and long term memory                 Following commands: Impaired Following commands impaired: Only follows one step commands consistently, Follows one step commands with increased time     Cueing  General Comments   Cueing Techniques: Verbal cues      Exercises     Shoulder Instructions      Home Living Family/patient expects to be discharged to:: Unsure                                 Additional Comments: Pt unable to tell, no family present      Prior Functioning/Environment Prior Level of Function : History of Falls (last six months);Patient poor historian/Family not available                    OT Problem List: Decreased strength;Cardiopulmonary status limiting activity;Decreased cognition;Decreased safety awareness;Impaired balance (sitting and/or standing);Decreased knowledge of use of DME or AE   OT Treatment/Interventions: Self-care/ADL training;Therapeutic exercise;Therapeutic activities;Cognitive remediation/compensation;DME and/or AE instruction;Patient/family education;Balance training      OT Goals(Current goals can be found in the care plan section)   Acute Rehab OT Goals Patient Stated Goal: have less headaches OT Goal Formulation: With patient Time For Goal Achievement: 10/08/23 Potential to Achieve Goals: Fair ADL Goals Pt Will Perform Lower Body Dressing: sit to/from stand;with supervision Pt Will Transfer to Toilet: with supervision;ambulating (LRAD) Pt Will Perform Toileting - Clothing Manipulation and hygiene: with  supervision;sitting/lateral leans Additional ADL Goal #1: Pt will follow 1-2 step commands during ADL/mobility to maximize safety, 5/5 opportunities.   OT Frequency:  Min 1X/week    Co-evaluation PT/OT/SLP Co-Evaluation/Treatment: Yes Reason for Co-Treatment: To address functional/ADL transfers;For patient/therapist safety PT goals addressed during session: Mobility/safety with mobility;Balance OT goals addressed during session: ADL's and self-care      AM-PAC OT "6 Clicks" Daily Activity     Outcome Measure Help from another person eating meals?: None Help from another person taking care of personal grooming?: A Little Help from another person toileting, which includes using toliet, bedpan, or urinal?: A Lot Help from another person bathing (including washing, rinsing, drying)?: A Lot Help from another person to put on and taking off regular upper body clothing?: A Little Help from another person to put on and taking off regular lower body clothing?: A Lot 6 Click Score: 16   End of Session Nurse Communication: Mobility status;Other (comment) (BP)  Activity Tolerance: Patient tolerated treatment well Patient left: in bed;with call bell/phone within reach;with bed alarm set  OT Visit Diagnosis: Other abnormalities of gait and mobility (R26.89);Repeated falls (R29.6);Muscle weakness (generalized) (M62.81);Other symptoms and signs involving cognitive function  Time: 9629-5284 OT Time Calculation (min): 14 min Charges:  OT General Charges $OT Visit: 1 Visit OT Evaluation $OT Eval Low Complexity: 1 Low  Berenda Breaker., MPH, MS, OTR/L ascom [phone] 09/24/23, 10:29 AM

## 2023-09-24 NOTE — H&P (Signed)
 History and Physical    Patient: Tammy Clayton NWG:[id number] DOB: 11-25-1932 DOA: 09/23/2023 DOS: the patient was seen and examined on 09/24/2023 PCP: Pcp, No  Patient coming from: SNF  Chief Complaint:  Chief Complaint  Patient presents with   Near Syncope    Pt brought in by EMS for syncopal episode.  Pt was seen here earlier today for abdominal pain, but d/c' d back to SNF.  Pt was found on the ground by staff at SNF, unresponsive, GCS 3, BP 60s/40s with agonal breathing.  EMS reports that once pt was placed on stretcher, symptoms had resolved and VSS.  Pt is now back to her baseline mentation.    HPI: Tammy Clayton is a 89 y.o. female with medical history significant for hypertension, seizure disorder, Alzheimer's dementia, and history of frequent falls brought in by EMS with an unresponsive event.  Patient was seen earlier in the day in the ED for abdominal pain and chest pain  with a reassuring workup that included CTA chest abdomen and pelvis, discharged back to SNF.  According to EMS, staff found her on the ground unresponsive with a GCS of 3 and with agonal breathing.  BP was 60/40.  Patient became more alert and returned to baseline spontaneously as EMS was lifting her onto the stretcher. ED course and data review: Upon arrival BP 154/82, temp 97.5, bradycardic in the high 50s with O2 sat at 100% on room air. Labs pertinent for unremarkable CBC, unremarkable CMP, noted creatinine 1.08 similar to prior.  First troponin of 8. Respiratory viral panel negative for COVID flu and RSV  EKG, personally viewed and interpreted showing sinus at 67 with nonspecific ST-T wave changes.  CT head and C-spine nonacute  Chest x-ray and UA not done  Patient treated with an NS bolus  Hospitalist consulted for admission.     Past Medical History:  Diagnosis Date   Anemia    Dementia (HCC)    Depression    GERD (gastroesophageal reflux disease)    Hypertension    Leukopenia    chronic    Memory change    Palpitations    Respiratory difficulty    h/o failure   Seizures (HCC)    Past Surgical History:  Procedure Laterality Date   CATARACT EXTRACTION W/PHACO Right 02/04/2015   Procedure: CATARACT EXTRACTION PHACO AND INTRAOCULAR LENS PLACEMENT (IOC);  Surgeon: Clair Crews, MD;  Location: [facility] ORS;  Service: Ophthalmology;  Laterality: Right;  us  1:16 ap 49.1 cde 19.71 fluid lot # [id number]   CATARACT EXTRACTION W/PHACO Left 04/29/2015   Procedure: CATARACT EXTRACTION PHACO AND INTRAOCULAR LENS PLACEMENT (IOC);  Surgeon: Clair Crews, MD;  Location: [facility] ORS;  Service: Ophthalmology;  Laterality: Left;  US  00:53 AP% 21.6 CDE 11.51 fluid pack lot # [id number]   EYE SURGERY     Social History:  reports that she has quit smoking. She has never used smokeless tobacco. She reports that she does not drink alcohol and does not use drugs.  No Known Allergies  History reviewed. No pertinent family history.  Prior to Admission medications   Medication Sig Start Date End Date Taking? Authorizing Provider  acetaminophen  (TYLENOL ) 325 MG tablet Take 650 mg by mouth every 6 (six) hours as needed for mild pain or moderate pain.    [provider]  amLODipine  (NORVASC ) 5 MG tablet Take 1 tablet (5 mg total) by mouth daily. 11/06/19   Garnet Just, MD  citalopram  (CELEXA ) 20 MG tablet Take 20 mg  by mouth daily.    [provider]  diclofenac Sodium (VOLTAREN) 1 % GEL Apply 2 g topically 3 (three) times daily as needed (wrist pain).    [provider]  levETIRAcetam  (KEPPRA ) 750 MG tablet Take 750 mg by mouth 2 (two) times daily.    [provider]  lisinopril  (ZESTRIL ) 20 MG tablet Take 1 tablet (20 mg total) by mouth in the morning and at bedtime. 11/05/19   Garnet Just, MD  omeprazole (PRILOSEC) 40 MG capsule Take 40 mg by mouth daily.    [provider]  ondansetron  (ZOFRAN -ODT) 4 MG disintegrating tablet Take 1 tablet (4 mg total)  by mouth every 8 (eight) hours as needed for nausea or vomiting. 09/23/23   Bradler, Evan K, MD  phenytoin  (DILANTIN ) 100 MG ER capsule Take 200 mg by mouth at bedtime.     [provider]  polyethylene glycol (MIRALAX  / GLYCOLAX ) 17 g packet Take 17 g by mouth daily as needed for mild constipation or moderate constipation.    [provider]  traZODone  (DESYREL ) 50 MG tablet Take 50 mg by mouth at bedtime.    [provider]    Physical Exam: Vitals:   09/23/23 2320 09/23/23 2327 09/24/23 0115 09/24/23 0204  BP:    (!) 167/75  Pulse:   (!) 57 (!) 59  Resp:  16 14 13   Temp:      TempSrc:      SpO2:  100% 100% 100%  Weight: 59 kg     Height: 5\' 2"  (1.575 m)      Physical Exam Vitals and nursing note reviewed.  Constitutional:      General: She is not in acute distress.    Comments: Restless, disoriented  HENT:     Head: Normocephalic and atraumatic.  Cardiovascular:     Rate and Rhythm: Regular rhythm. Bradycardia present.     Heart sounds: Normal heart sounds.  Pulmonary:     Effort: Pulmonary effort is normal.     Breath sounds: Normal breath sounds.  Abdominal:     Palpations: Abdomen is soft.     Tenderness: There is no abdominal tenderness.  Neurological:     General: No focal deficit present.     Labs on Admission: I have personally reviewed following labs and imaging studies  CBC: Recent Labs  Lab 09/23/23 1125 09/24/23 0105  WBC 4.2 7.3  HGB 11.6* 12.8  HCT 40.0 43.0  MCV 79.4* 77.5*  PLT 92* 193   Basic Metabolic Panel: Recent Labs  Lab 09/23/23 1125 09/24/23 0105  NA 139 138  K 4.3 3.7  CL 105 102  CO2 25 24  GLUCOSE 99 137*  BUN 24* 20  CREATININE 1.01* 1.08*  CALCIUM 9.7 10.2   GFR: Estimated Creatinine Clearance: 27.9 mL/min (A) (by C-G formula based on SCr of 1.08 mg/dL (H)). Liver Function Tests: Recent Labs  Lab 09/23/23 1125 09/24/23 0105  AST 22 24  ALT 12 13  ALKPHOS 41 49  BILITOT 0.5 0.7  PROT  7.7 8.9*  ALBUMIN 3.9 4.7   Recent Labs  Lab 09/23/23 1125  LIPASE 58*   No results for input(s): "AMMONIA" in the last 168 hours. Coagulation Profile: No results for input(s): "INR", "PROTIME" in the last 168 hours. Cardiac Enzymes: No results for input(s): "CKTOTAL", "CKMB", "CKMBINDEX", "TROPONINI" in the last 168 hours. BNP (last 3 results) No results for input(s): "PROBNP" in the last 8760 hours. HbA1C: No results for input(s): "  HGBA1C" in the last 72 hours. CBG: No results for input(s): "GLUCAP" in the last 168 hours. Lipid Profile: No results for input(s): "CHOL", "HDL", "LDLCALC", "TRIG", "CHOLHDL", "LDLDIRECT" in the last 72 hours. Thyroid Function Tests: No results for input(s): "TSH", "T4TOTAL", "FREET4", "T3FREE", "THYROIDAB" in the last 72 hours. Anemia Panel: No results for input(s): "VITAMINB12", "FOLATE", "FERRITIN", "TIBC", "IRON", "RETICCTPCT" in the last 72 hours. Urine analysis:    Component Value Date/Time   COLORURINE YELLOW (A) 09/23/2023 1402   APPEARANCEUR CLOUDY (A) 09/23/2023 1402   APPEARANCEUR Cloudy 06/05/2011 1321   LABSPEC 1.011 09/23/2023 1402   LABSPEC 1.016 06/05/2011 1321   PHURINE 5.0 09/23/2023 1402   GLUCOSEU NEGATIVE 09/23/2023 1402   GLUCOSEU Negative 06/05/2011 1321   HGBUR NEGATIVE 09/23/2023 1402   BILIRUBINUR NEGATIVE 09/23/2023 1402   BILIRUBINUR Negative 06/05/2011 1321   KETONESUR NEGATIVE 09/23/2023 1402   PROTEINUR NEGATIVE 09/23/2023 1402   NITRITE NEGATIVE 09/23/2023 1402   LEUKOCYTESUR NEGATIVE 09/23/2023 1402   LEUKOCYTESUR 2+ 06/05/2011 1321    Radiological Exams on Admission: CT HEAD WO CONTRAST ( ) Result Date: 09/24/2023 CLINICAL DATA:  Head trauma, minor (Age >= 65y); Neck trauma (Age >= 65y) EXAM: CT HEAD WITHOUT CONTRAST CT CERVICAL SPINE WITHOUT CONTRAST TECHNIQUE: Multidetector CT imaging of the head and cervical spine was performed following the standard protocol without intravenous contrast. Multiplanar  CT image reconstructions of the cervical spine were also generated. RADIATION DOSE REDUCTION: This exam was performed according to the departmental dose-optimization program which includes automated exposure control, adjustment of the mA and/or kV according to patient size and/or use of iterative reconstruction technique. COMPARISON:  None Available. FINDINGS: CT HEAD FINDINGS Brain: No evidence of acute infarction, hemorrhage, hydrocephalus, extra-axial collection or mass lesion/mass effect. Remote right frontal infarct. Patchy white matter hypodensities, compatible with chronic microvascular ischemic disease. Vascular: Calcific atherosclerosis. Skull: No acute fracture. Sinuses/Orbits: Mostly clear sinuses.  Neck overall findings. Other: No mastoid effusions. CT CERVICAL SPINE FINDINGS Alignment: Similar bursal the normal cervical lordosis. No substantial sagittal subluxation. Skull base and vertebrae: No acute fracture. No primary bone lesion or focal pathologic process. Soft tissues and spinal canal: No prevertebral fluid or swelling. No visible canal hematoma. Disc levels: Similar multilevel degenerative change including facet uncovertebral hypertrophy with varying degrees of neural foraminal stenosis. Upper chest: Visualized lung apices are clear. IMPRESSION: No evidence of acute abnormality intracranially or in the cervical spine. Electronically Signed   By: Stevenson Elbe M.D.   On: 09/24/2023 00:26   CT Cervical Spine Wo Contrast Result Date: 09/24/2023 CLINICAL DATA:  Head trauma, minor (Age >= 65y); Neck trauma (Age >= 65y) EXAM: CT HEAD WITHOUT CONTRAST CT CERVICAL SPINE WITHOUT CONTRAST TECHNIQUE: Multidetector CT imaging of the head and cervical spine was performed following the standard protocol without intravenous contrast. Multiplanar CT image reconstructions of the cervical spine were also generated. RADIATION DOSE REDUCTION: This exam was performed according to the departmental  dose-optimization program which includes automated exposure control, adjustment of the mA and/or kV according to patient size and/or use of iterative reconstruction technique. COMPARISON:  None Available. FINDINGS: CT HEAD FINDINGS Brain: No evidence of acute infarction, hemorrhage, hydrocephalus, extra-axial collection or mass lesion/mass effect. Remote right frontal infarct. Patchy white matter hypodensities, compatible with chronic microvascular ischemic disease. Vascular: Calcific atherosclerosis. Skull: No acute fracture. Sinuses/Orbits: Mostly clear sinuses.  Neck overall findings. Other: No mastoid effusions. CT CERVICAL SPINE FINDINGS Alignment: Similar bursal the normal cervical lordosis. No substantial sagittal subluxation. Skull base and vertebrae:  No acute fracture. No primary bone lesion or focal pathologic process. Soft tissues and spinal canal: No prevertebral fluid or swelling. No visible canal hematoma. Disc levels: Similar multilevel degenerative change including facet uncovertebral hypertrophy with varying degrees of neural foraminal stenosis. Upper chest: Visualized lung apices are clear. IMPRESSION: No evidence of acute abnormality intracranially or in the cervical spine. Electronically Signed   By: Stevenson Elbe M.D.   On: 09/24/2023 00:26   CT Angio Chest/Abd/Pel for Dissection W and/or Wo Contrast Result Date: 09/23/2023 CLINICAL DATA:  abd and back pain, emesis, hypertensive EXAM: CT ANGIOGRAPHY CHEST, ABDOMEN AND PELVIS TECHNIQUE: Non-contrast CT of the chest was initially obtained. Multidetector CT imaging through the chest, abdomen and pelvis was performed using the standard protocol during bolus administration of intravenous contrast. Multiplanar reconstructed images and MIPs were obtained and reviewed to evaluate the vascular anatomy. RADIATION DOSE REDUCTION: This exam was performed according to the departmental dose-optimization program which includes automated exposure  control, adjustment of the mA and/or kV according to patient size and/or use of iterative reconstruction technique. CONTRAST:  80mL OMNIPAQUE  IOHEXOL  350 MG/ML SOLN COMPARISON:  None Available. FINDINGS: CTA CHEST FINDINGS Cardiovascular: Preferential opacification of the thoracic aorta. No evidence of thoracic aortic aneurysm or dissection. Atherosclerotic calcification of the thoracic aorta. No central pulmonary embolism. Cardiomegaly. Multivessel coronary artery calcifications. No pericardial effusion. Mediastinum/Nodes: No enlarged mediastinal, hilar, or axillary lymph nodes. Thyroid gland, trachea, and esophagus demonstrate no significant findings. Lungs/Pleura: Lungs are clear. No suspicious pulmonary nodule. No pleural effusion or pneumothorax. Musculoskeletal: Remote appearing right posterior seventh rib fracture. Advanced degenerative changes of the right glenohumeral joint. Partially visualized left shoulder arthroplasty with surrounding heterotopic ossification. Review of the MIP images confirms the above findings. CTA ABDOMEN AND PELVIS FINDINGS VASCULAR Aorta: Normal caliber aorta without aneurysm, dissection, vasculitis or significant stenosis. Atherosclerotic calcification. Celiac: Patent without evidence of aneurysm, dissection, vasculitis or significant stenosis. SMA: Patent without evidence of aneurysm, dissection, vasculitis or significant stenosis. Renals: Both renal arteries are patent without evidence of aneurysm, dissection, vasculitis, or significant stenosis. IMA: Patent without evidence of aneurysm, dissection, vasculitis or significant stenosis. Inflow: Patent without evidence of aneurysm, dissection, vasculitis or significant stenosis. Veins: No obvious venous abnormality within the limitations of this arterial phase study. Review of the MIP images confirms the above findings. NON-VASCULAR Hepatobiliary: No suspicious focal hepatic lesion. The gallbladder is not clearly visualized and may  be surgically absent. No biliary dilatation. Pancreas: Unremarkable. No pancreatic ductal dilatation or surrounding inflammatory changes. Spleen: Unremarkable. Adrenals/Urinary Tract: Adrenal glands are unremarkable. Kidneys enhance symmetrically. No suspicious focal lesion. No renal calculi or hydronephrosis. Bladder is unremarkable. Stomach/Bowel: Stomach is collapsed, limiting detailed evaluation. No evidence of obstruction or inflammatory changes. Moderate volume of stool noted in the rectum. Lymphatic: No enlarged abdominal or pelvic lymph nodes. Reproductive: Status post hysterectomy. No adnexal masses. Other: No abdominal wall hernia or abnormality. No abdominopelvic ascites. No free air. Musculoskeletal: Age indeterminate superior compression deformity of the L1 vertebral body with 30-40% height loss. No osseous retropulsion. Multilevel degenerative changes of the thoracolumbar spine with disc height loss and endplate osteophytosis, most pronounced at L5-S1. Multilevel facet arthropathy of the lumbar spine. Degenerative changes of the bilateral hips. No suspicious osseous lesion. Review of the MIP images confirms the above findings. IMPRESSION: 1. No evidence of aortic aneurysm or dissection. 2. Age indeterminate superior compression deformity of the L1 vertebral body with 30-40% height loss. No osseous retropulsion. 3. Remote appearing right posterior seventh rib fracture.  4. Gallbladder is not clearly visualized and may be surgically absent. 5. Cardiomegaly with multivessel coronary artery calcifications. 6.  Aortic Atherosclerosis (ICD10-I70.0). Electronically Signed   By: Mannie Seek M.D.   On: 09/23/2023 16:00   Data Reviewed for HPI: Relevant notes from primary care and specialist visits, past discharge summaries as available in EHR, including Care Everywhere. Prior diagnostic testing as pertinent to current admission diagnoses Updated medications and problem lists for reconciliation ED  course, including vitals, labs, imaging, treatment and response to treatment Triage notes, nursing and pharmacy notes and ED provider's notes Notable results as noted above in HPI      Assessment and Plan: * Unresponsive episode Sinus bradycardia Hypotensive episode Syncope, possibly arrhythmic event, less likely seizure BP 60/40 with EMS, spontaneously improving Continuous cardiac monitoring, echocardiogram, carotid Doppler CT head and C-spine negative Neurologic checks Q4 Aspiration precautions Will keep n.p.o. for now  Back pain Patient seen earlier in the ED on 5/16 for back pain and abdominal pain CTA chest abdomen and pelvis with no acute findings Currently without complaints Continue to monitor for recurrence of symptoms Follow UA  Seizure disorder (HCC) IV Keppra  and phenytoin  while n.p.o.   Essential hypertension Hold home amlodipine  and lisinopril  due to syncopal event with hypotension  Dementia without behavioral disturbance (HCC) Delirium precautions    DVT prophylaxis: Lovenox   Consults: none  Advance Care Planning:   Code Status: Prior   Family Communication: none  Disposition Plan: Back to previous home environment  Severity of Illness: The appropriate patient status for this patient is OBSERVATION. Observation status is judged to be reasonable and necessary in order to provide the required intensity of service to ensure the patient's safety. The patient's presenting symptoms, physical exam findings, and initial radiographic and laboratory data in the context of their medical condition is felt to place them at decreased risk for further clinical deterioration. Furthermore, it is anticipated that the patient will be medically stable for discharge from the hospital within 2 midnights of admission.   Author: Lanetta Pion, MD 09/24/2023 3:02 AM  For on call review [email or web address].

## 2023-09-24 NOTE — ED Notes (Signed)
 Report called to nurse Nira Basset at [facility].

## 2023-09-24 NOTE — Assessment & Plan Note (Signed)
 Delirium precautions

## 2023-09-24 NOTE — Assessment & Plan Note (Addendum)
 Sinus bradycardia Hypotensive episode Syncope, possibly arrhythmic event, less likely seizure BP 60/40 with EMS, spontaneously improving Continuous cardiac monitoring, echocardiogram, carotid Doppler CT head and C-spine negative Neurologic checks Q4 Aspiration precautions Will keep n.p.o. for now

## 2023-09-24 NOTE — Assessment & Plan Note (Signed)
 Patient seen earlier in the ED on 5/16 for back pain and abdominal pain CTA chest abdomen and pelvis with no acute findings Currently without complaints Continue to monitor for recurrence of symptoms Follow UA

## 2023-09-24 NOTE — Discharge Summary (Signed)
 Physician Discharge Summary   Patient: Tammy Clayton MRN: [id number] DOB: [birth year]  Admit date:     09/23/2023  Discharge date: 09/24/23  Discharge Physician: Melvinia Stager   PCP: Pcp, No   Recommendations at discharge:    Keep log of BP to be reviewed by PCP and adjust BP meds  Discharge Diagnoses: Principal Problem:   Unresponsive episode Active Problems:   Dementia without behavioral disturbance (HCC)   Essential hypertension   Seizure disorder (HCC)   Back pain  Tammy Clayton is a 89 y.o. female with medical history significant for hypertension, seizure disorder, Alzheimer's dementia, and history of frequent falls brought in by EMS with an unresponsive event.  Patient was seen earlier in the day in the ED for abdominal pain and chest pain  with a reassuring workup that included CTA chest abdomen and pelvis, discharged back to SNF.  According to EMS, staff found her on the ground unresponsive with a GCS of 3 and with agonal breathing.  BP was 60/40.  Patient became more alert and returned to baseline spontaneously as EMS was lifting her onto the stretcher.   Labs pertinent for unremarkable CBC, unremarkable CMP, noted creatinine 1.08 similar to prior.  First troponin of 8. Respiratory viral panel negative for COVID flu and RSV  CT head and C-spine non acute  Chest x-ray and UA not done  Transient hypotension-- resolved, etiology unclear history of hypertension -- patient received bolus of normal saline the ER. Blood pressure since then has been stable and on the higher side -- resumed BP meds-- for now I have resume lisinopril . If needed nursing home facility can start amlodipine  -- patient back to her baseline mentation with history of dementia  History of seizure disorder -- continue Keppra   Dementia with behavioral changes --delirium precautions  Patient overall had relatedly stable ER stay. She'll return back [facility]. I spoke with patient's sister Tammy Clayton and updated her. She appreciated my phone call.       Disposition: Long term care facility Diet recommendation:  Discharge Diet Orders (From admission, onward)     Start     Ordered   09/24/23 0000  Diet - low sodium heart healthy        09/24/23 1510           Cardiac diet DISCHARGE MEDICATION: Allergies as of 09/24/2023   No Known Allergies      Medication List     STOP taking these medications    amLODipine  5 MG tablet Commonly known as: NORVASC        TAKE these medications    acetaminophen  325 MG tablet Commonly known as: TYLENOL  Take 1,300 mg by mouth every 6 (six) hours as needed for mild pain (pain score 1-3) or moderate pain (pain score 4-6).   cholecalciferol 25 MCG (1000 UNIT) tablet Commonly known as: VITAMIN D3 Take 1,000 Units by mouth daily.   citalopram  10 MG tablet Commonly known as: CELEXA  Take 10 mg by mouth daily.   GNP Tussin DM 20-200 MG/10ML liquid Generic drug: dextromethorphan-guaiFENesin Take 10 mLs by mouth every 6 (six) hours as needed.   levETIRAcetam  750 MG tablet Commonly known as: KEPPRA  Take 750 mg by mouth at bedtime.   levETIRAcetam  500 MG tablet Commonly known as: KEPPRA  Take 500 mg by mouth daily.   lisinopril  30 MG tablet Commonly known as: ZESTRIL  Take 30 mg by mouth daily.   mirtazapine 7.5 MG tablet Commonly known as: REMERON Take 7.5  mg by mouth at bedtime.   omeprazole 40 MG capsule Commonly known as: PRILOSEC Take 40 mg by mouth daily.   ondansetron  4 MG disintegrating tablet Commonly known as: ZOFRAN -ODT Take 1 tablet (4 mg total) by mouth every 8 (eight) hours as needed for nausea or vomiting.   polyethylene glycol 17 g packet Commonly known as: MIRALAX  / GLYCOLAX  Take 17 g by mouth daily as needed for mild constipation or moderate constipation.        Discharge Exam: Filed Weights   09/23/23 2320  Weight: 59 kg     Condition at discharge: fair  The results of  significant diagnostics from this hospitalization (including imaging, microbiology, ancillary and laboratory) are listed below for reference.   Imaging Studies: CT HEAD WO CONTRAST ( ) Result Date: 09/24/2023 CLINICAL DATA:  Head trauma, minor (Age >= 65y); Neck trauma (Age >= 65y) EXAM: CT HEAD WITHOUT CONTRAST CT CERVICAL SPINE WITHOUT CONTRAST TECHNIQUE: Multidetector CT imaging of the head and cervical spine was performed following the standard protocol without intravenous contrast. Multiplanar CT image reconstructions of the cervical spine were also generated. RADIATION DOSE REDUCTION: This exam was performed according to the departmental dose-optimization program which includes automated exposure control, adjustment of the mA and/or kV according to patient size and/or use of iterative reconstruction technique. COMPARISON:  None Available. FINDINGS: CT HEAD FINDINGS Brain: No evidence of acute infarction, hemorrhage, hydrocephalus, extra-axial collection or mass lesion/mass effect. Remote right frontal infarct. Patchy white matter hypodensities, compatible with chronic microvascular ischemic disease. Vascular: Calcific atherosclerosis. Skull: No acute fracture. Sinuses/Orbits: Mostly clear sinuses.  Neck overall findings. Other: No mastoid effusions. CT CERVICAL SPINE FINDINGS Alignment: Similar bursal the normal cervical lordosis. No substantial sagittal subluxation. Skull base and vertebrae: No acute fracture. No primary bone lesion or focal pathologic process. Soft tissues and spinal canal: No prevertebral fluid or swelling. No visible canal hematoma. Disc levels: Similar multilevel degenerative change including facet uncovertebral hypertrophy with varying degrees of neural foraminal stenosis. Upper chest: Visualized lung apices are clear. IMPRESSION: No evidence of acute abnormality intracranially or in the cervical spine. Electronically Signed   By: Stevenson Elbe M.D.   On: 09/24/2023 00:26    CT Cervical Spine Wo Contrast Result Date: 09/24/2023 CLINICAL DATA:  Head trauma, minor (Age >= 65y); Neck trauma (Age >= 65y) EXAM: CT HEAD WITHOUT CONTRAST CT CERVICAL SPINE WITHOUT CONTRAST TECHNIQUE: Multidetector CT imaging of the head and cervical spine was performed following the standard protocol without intravenous contrast. Multiplanar CT image reconstructions of the cervical spine were also generated. RADIATION DOSE REDUCTION: This exam was performed according to the departmental dose-optimization program which includes automated exposure control, adjustment of the mA and/or kV according to patient size and/or use of iterative reconstruction technique. COMPARISON:  None Available. FINDINGS: CT HEAD FINDINGS Brain: No evidence of acute infarction, hemorrhage, hydrocephalus, extra-axial collection or mass lesion/mass effect. Remote right frontal infarct. Patchy white matter hypodensities, compatible with chronic microvascular ischemic disease. Vascular: Calcific atherosclerosis. Skull: No acute fracture. Sinuses/Orbits: Mostly clear sinuses.  Neck overall findings. Other: No mastoid effusions. CT CERVICAL SPINE FINDINGS Alignment: Similar bursal the normal cervical lordosis. No substantial sagittal subluxation. Skull base and vertebrae: No acute fracture. No primary bone lesion or focal pathologic process. Soft tissues and spinal canal: No prevertebral fluid or swelling. No visible canal hematoma. Disc levels: Similar multilevel degenerative change including facet uncovertebral hypertrophy with varying degrees of neural foraminal stenosis. Upper chest: Visualized lung apices are clear. IMPRESSION: No evidence of  acute abnormality intracranially or in the cervical spine. Electronically Signed   By: Stevenson Elbe M.D.   On: 09/24/2023 00:26   CT Angio Chest/Abd/Pel for Dissection W and/or Wo Contrast Result Date: 09/23/2023 CLINICAL DATA:  abd and back pain, emesis, hypertensive EXAM: CT  ANGIOGRAPHY CHEST, ABDOMEN AND PELVIS TECHNIQUE: Non-contrast CT of the chest was initially obtained. Multidetector CT imaging through the chest, abdomen and pelvis was performed using the standard protocol during bolus administration of intravenous contrast. Multiplanar reconstructed images and MIPs were obtained and reviewed to evaluate the vascular anatomy. RADIATION DOSE REDUCTION: This exam was performed according to the departmental dose-optimization program which includes automated exposure control, adjustment of the mA and/or kV according to patient size and/or use of iterative reconstruction technique. CONTRAST:  80mL OMNIPAQUE  IOHEXOL  350 MG/ML SOLN COMPARISON:  None Available. FINDINGS: CTA CHEST FINDINGS Cardiovascular: Preferential opacification of the thoracic aorta. No evidence of thoracic aortic aneurysm or dissection. Atherosclerotic calcification of the thoracic aorta. No central pulmonary embolism. Cardiomegaly. Multivessel coronary artery calcifications. No pericardial effusion. Mediastinum/Nodes: No enlarged mediastinal, hilar, or axillary lymph nodes. Thyroid gland, trachea, and esophagus demonstrate no significant findings. Lungs/Pleura: Lungs are clear. No suspicious pulmonary nodule. No pleural effusion or pneumothorax. Musculoskeletal: Remote appearing right posterior seventh rib fracture. Advanced degenerative changes of the right glenohumeral joint. Partially visualized left shoulder arthroplasty with surrounding heterotopic ossification. Review of the MIP images confirms the above findings. CTA ABDOMEN AND PELVIS FINDINGS VASCULAR Aorta: Normal caliber aorta without aneurysm, dissection, vasculitis or significant stenosis. Atherosclerotic calcification. Celiac: Patent without evidence of aneurysm, dissection, vasculitis or significant stenosis. SMA: Patent without evidence of aneurysm, dissection, vasculitis or significant stenosis. Renals: Both renal arteries are patent without evidence  of aneurysm, dissection, vasculitis, or significant stenosis. IMA: Patent without evidence of aneurysm, dissection, vasculitis or significant stenosis. Inflow: Patent without evidence of aneurysm, dissection, vasculitis or significant stenosis. Veins: No obvious venous abnormality within the limitations of this arterial phase study. Review of the MIP images confirms the above findings. NON-VASCULAR Hepatobiliary: No suspicious focal hepatic lesion. The gallbladder is not clearly visualized and may be surgically absent. No biliary dilatation. Pancreas: Unremarkable. No pancreatic ductal dilatation or surrounding inflammatory changes. Spleen: Unremarkable. Adrenals/Urinary Tract: Adrenal glands are unremarkable. Kidneys enhance symmetrically. No suspicious focal lesion. No renal calculi or hydronephrosis. Bladder is unremarkable. Stomach/Bowel: Stomach is collapsed, limiting detailed evaluation. No evidence of obstruction or inflammatory changes. Moderate volume of stool noted in the rectum. Lymphatic: No enlarged abdominal or pelvic lymph nodes. Reproductive: Status post hysterectomy. No adnexal masses. Other: No abdominal wall hernia or abnormality. No abdominopelvic ascites. No free air. Musculoskeletal: Age indeterminate superior compression deformity of the L1 vertebral body with 30-40% height loss. No osseous retropulsion. Multilevel degenerative changes of the thoracolumbar spine with disc height loss and endplate osteophytosis, most pronounced at L5-S1. Multilevel facet arthropathy of the lumbar spine. Degenerative changes of the bilateral hips. No suspicious osseous lesion. Review of the MIP images confirms the above findings. IMPRESSION: 1. No evidence of aortic aneurysm or dissection. 2. Age indeterminate superior compression deformity of the L1 vertebral body with 30-40% height loss. No osseous retropulsion. 3. Remote appearing right posterior seventh rib fracture. 4. Gallbladder is not clearly visualized  and may be surgically absent. 5. Cardiomegaly with multivessel coronary artery calcifications. 6.  Aortic Atherosclerosis (ICD10-I70.0). Electronically Signed   By: Mannie Seek M.D.   On: 09/23/2023 16:00    Microbiology: Results for orders placed or performed during the hospital encounter of  09/23/23  Resp panel by RT-PCR (RSV, Flu A&B, Covid) Anterior Nasal Swab     Status: None   Collection Time: 09/24/23 12:47 AM   Specimen: Anterior Nasal Swab  Result Value Ref Range Status   SARS Coronavirus 2 by RT PCR NEGATIVE NEGATIVE Final    Comment: (NOTE) SARS-CoV-2 target nucleic acids are NOT DETECTED.  The SARS-CoV-2 RNA is generally detectable in upper respiratory specimens during the acute phase of infection. The lowest concentration of SARS-CoV-2 viral copies this assay can detect is 138 copies/mL. A negative result does not preclude SARS-Cov-2 infection and should not be used as the sole basis for treatment or other patient management decisions. A negative result may occur with  improper specimen collection/handling, submission of specimen other than nasopharyngeal swab, presence of viral mutation(s) within the areas targeted by this assay, and inadequate number of viral copies(<138 copies/mL). A negative result must be combined with clinical observations, patient history, and epidemiological information. The expected result is Negative.  Fact Sheet for Patients:  BloggerCourse.com  Fact Sheet for Healthcare Providers:  SeriousBroker.it  This test is no t yet approved or cleared by the [place]  FDA and  has been authorized for detection and/or diagnosis of SARS-CoV-2 by FDA under an Emergency Use Authorization (EUA). This EUA will remain  in effect (meaning this test can be used) for the duration of the COVID-19 declaration under Section 564(b)(1) of the Act, 21 U.S.C.section 360bbb-3(b)(1), unless the authorization is  terminated  or revoked sooner.       Influenza A by PCR NEGATIVE NEGATIVE Final   Influenza B by PCR NEGATIVE NEGATIVE Final    Comment: (NOTE) The Xpert Xpress SARS-CoV-2/FLU/RSV plus assay is intended as an aid in the diagnosis of influenza from Nasopharyngeal swab specimens and should not be used as a sole basis for treatment. Nasal washings and aspirates are unacceptable for Xpert Xpress SARS-CoV-2/FLU/RSV testing.  Fact Sheet for Patients: BloggerCourse.com  Fact Sheet for Healthcare Providers: SeriousBroker.it  This test is not yet approved or cleared by the [place]  FDA and has been authorized for detection and/or diagnosis of SARS-CoV-2 by FDA under an Emergency Use Authorization (EUA). This EUA will remain in effect (meaning this test can be used) for the duration of the COVID-19 declaration under Section 564(b)(1) of the Act, 21 U.S.C. section 360bbb-3(b)(1), unless the authorization is terminated or revoked.     Resp Syncytial Virus by PCR NEGATIVE NEGATIVE Final    Comment: (NOTE) Fact Sheet for Patients: BloggerCourse.com  Fact Sheet for Healthcare Providers: SeriousBroker.it  This test is not yet approved or cleared by the [place]  FDA and has been authorized for detection and/or diagnosis of SARS-CoV-2 by FDA under an Emergency Use Authorization (EUA). This EUA will remain in effect (meaning this test can be used) for the duration of the COVID-19 declaration under Section 564(b)(1) of the Act, 21 U.S.C. section 360bbb-3(b)(1), unless the authorization is terminated or revoked.  Performed at [facility], [address] [place], [place] [address]     Labs: CBC: Recent Labs  Lab 09/23/23 1125 09/24/23 0105  WBC 4.2 7.3  HGB 11.6* 12.8  HCT 40.0 43.0  MCV 79.4* 77.5*  PLT 92* 193   Basic Metabolic Panel: Recent Labs  Lab  09/23/23 1125 09/24/23 0105  NA 139 138  K 4.3 3.7  CL 105 102  CO2 25 24  GLUCOSE 99 137*  BUN 24* 20  CREATININE 1.01* 1.08*  CALCIUM 9.7 10.2  Liver Function Tests: Recent Labs  Lab 09/23/23 1125 09/24/23 0105  AST 22 24  ALT 12 13  ALKPHOS 41 49  BILITOT 0.5 0.7  PROT 7.7 8.9*  ALBUMIN 3.9 4.7   CBG: Recent Labs  Lab 09/24/23 0512  GLUCAP 88    Discharge time spent: greater than 30 minutes.  Signed: Melvinia Stager, MD [facility] 09/24/2023

## 2023-09-24 NOTE — TOC Progression Note (Signed)
 Transition of Care [facility]) - Progression Note    Patient Details  Name: Tammy Clayton MRN: [id number] Date of Birth: [birth year]  [facility] CM/SW Contact  Zoe Hinds, RN Phone Number: 09/24/2023, 4:30 PM  Clinical Narrative:    Pt medically cleared to dc back to facility per MD. DC order in chart. This CM called and spoke with admission liaison  at [facility] Bernardo Bridgeman @ [phone] and confirmed pt's bed was available and she can be accepted back today also informed to alert pt's [facility] program. [facility] On call Administrator Reese Canny [phone] informed and requested this CM coordinate dc transportation. This CM coordinated with [facility] & confirmed pick up around 10 pm , Liaison Bernardo Bridgeman made aware and confirmed pt can admit at this time medical team made aware  . DC packet left in pt's chart. No additional dc needs requested at this time .      Barriers to Discharge: No Barriers Identified  Expected Discharge Date: 09/24/23                   Social Determinants of Health (SDOH) Interventions SDOH Screenings   Tobacco Use: Medium Risk (09/23/2023)    Readmission Risk Interventions     No data to display

## 2023-09-24 NOTE — ED Notes (Signed)
 Pt changed into gown. Pt appears incontinent of urine and stool in brief. Pt cleaned and placed into clean brief.

## 2023-09-24 NOTE — ED Notes (Signed)
 Pt transported to CT ?

## 2023-09-24 NOTE — ED Notes (Signed)
 Pt returned from CT

## 2023-09-24 NOTE — Progress Notes (Signed)
 PHARMACIST - PHYSICIAN COMMUNICATION  CONCERNING:  Enoxaparin  (Lovenox ) for DVT Prophylaxis    RECOMMENDATION: Patient was prescribed enoxaprin 40mg  q24 hours for VTE prophylaxis.   Filed Weights   09/23/23 2320  Weight: 59 kg (130 lb 1.1 oz)    Body mass index is 23.79 kg/m.  Estimated Creatinine Clearance: 27.9 mL/min (A) (by C-G formula based on SCr of 1.08 mg/dL (H)).  Patient is candidate for enoxaparin  30mg  every 24 hours based on CrCl <61ml/min or Weight <45kg  DESCRIPTION: Pharmacy has adjusted enoxaparin  dose per [facility] policy.  Patient is now receiving enoxaparin  30 mg every 24 hours   Coretta Dexter, PharmD, [facility] 09/24/2023 3:12 AM

## 2023-09-24 NOTE — Assessment & Plan Note (Signed)
 IV Keppra  and phenytoin  while n.p.o.

## 2023-09-24 NOTE — Assessment & Plan Note (Signed)
 Hold home amlodipine  and lisinopril  due to syncopal event with hypotension

## 2023-09-24 NOTE — Evaluation (Signed)
 Physical Therapy Evaluation Patient Details Name: Tammy Clayton MRN: [id number] DOB: Jul 31, 1932 Today's Date: 09/24/2023  History of Present Illness  89 y/o female presented to ED on 09/23/23 from [facility] for syncopal episode. Seen earlier in the day for abdominal pain. Admitted for hypotensive episode and bradycardia. PMH: Alzheimer's dementia, HTN, seizure, frequent falls  Clinical Impression  Patient admitted with the above. PTA, patient from [facility] but unsure of level of care (SNF vs memory care) and unsure of PLOF, but per chart, patient with frequent falls. Patient pleasantly confused throughout, oriented to self, and following simple commands. Required minA for bed mobility and CGA for sit to stand with HHAx2. Able to take steps towards [facility] with CGA and HHAx2, however limited due to elevated BP, see below. Patient will benefit from skilled PT services during acute stay to address listed deficits. Patient will benefit from ongoing therapy at discharge to maximize functional independence and safety.   Orthostatic BPs  Supine 195/87  Sitting 192/97  Standing 172/101  Supine  194/84      If plan is discharge home, recommend the following: A little help with walking and/or transfers;A little help with bathing/dressing/bathroom;Assistance with cooking/housework;Direct supervision/assist for medications management;Direct supervision/assist for financial management;Supervision due to cognitive status   Can travel by private vehicle        Equipment Recommendations Other (comment) (TBD)  Recommendations for Other Services       Functional Status Assessment Patient has had a recent decline in their functional status and demonstrates the ability to make significant improvements in function in a reasonable and predictable amount of time.     Precautions / Restrictions Precautions Precautions: Fall Recall of Precautions/Restrictions: Impaired Restrictions Weight Bearing  Restrictions Per Provider Order: No      Mobility  Bed Mobility Overal bed mobility: Needs Assistance Bed Mobility: Supine to Sit, Sit to Supine     Supine to sit: Supervision, HOB elevated Sit to supine: Min assist        Transfers Overall transfer level: Needs assistance Equipment used: 2 person hand held assist Transfers: Sit to/from Stand Sit to Stand: Contact guard assist                Ambulation/Gait Ambulation/Gait assistance: Contact guard assist Gait Distance (Feet): 3 Feet Assistive device: 2 person hand held assist Gait Pattern/deviations: Step-to pattern Gait velocity: decreased     General Gait Details: limited due to elevated BP  Stairs            Wheelchair Mobility     Tilt Bed    Modified Rankin (Stroke Patients Only)       Balance Overall balance assessment: Needs assistance Sitting-balance support: No upper extremity supported, Feet supported Sitting balance-Leahy Scale: Fair     Standing balance support: Bilateral upper extremity supported Standing balance-Leahy Scale: Poor                               Pertinent Vitals/Pain Pain Assessment Pain Assessment: Faces Faces Pain Scale: Hurts a little bit Pain Location: headache Pain Descriptors / Indicators: Headache Pain Intervention(s): Limited activity within patient's tolerance, Monitored during session    Home Living Family/patient expects to be discharged to:: Skilled nursing facility                   Additional Comments: from [facility] - unsure what level of care (memory vs SNF)  Prior Function Prior Level of Function : History of Falls (last six months);Patient poor historian/Family not available                     Extremity/Trunk Assessment   Upper Extremity Assessment Upper Extremity Assessment: Generalized weakness    Lower Extremity Assessment Lower Extremity Assessment: Generalized weakness        Communication   Communication Communication: No apparent difficulties    Cognition Arousal: Alert Behavior During Therapy: WFL for tasks assessed/performed   PT - Cognitive impairments: History of cognitive impairments                       PT - Cognition Comments: hx of dementia. Oriented to self Following commands: Impaired Following commands impaired: Only follows one step commands consistently, Follows one step commands with increased time     Cueing Cueing Techniques: Verbal cues     General Comments      Exercises     Assessment/Plan    PT Assessment Patient needs continued PT services  PT Problem List Decreased strength;Decreased activity tolerance;Decreased mobility;Decreased coordination;Decreased balance;Decreased cognition;Decreased knowledge of use of DME;Decreased knowledge of precautions;Decreased safety awareness;Cardiopulmonary status limiting activity       PT Treatment Interventions Gait training;DME instruction;Functional mobility training;Therapeutic activities;Therapeutic exercise;Balance training;Patient/family education    PT Goals (Current goals can be found in the Care Plan section)  Acute Rehab PT Goals Patient Stated Goal: did not state PT Goal Formulation: Patient unable to participate in goal setting Time For Goal Achievement: 10/08/23 Potential to Achieve Goals: Fair    Frequency Min 1X/week     Co-evaluation PT/OT/SLP Co-Evaluation/Treatment: Yes Reason for Co-Treatment: To address functional/ADL transfers;For patient/therapist safety PT goals addressed during session: Mobility/safety with mobility;Balance OT goals addressed during session: ADL's and self-care       AM-PAC PT "6 Clicks" Mobility  Outcome Measure Help needed turning from your back to your side while in a flat bed without using bedrails?: A Little Help needed moving from lying on your back to sitting on the side of a flat bed without using bedrails?: A  Little Help needed moving to and from a bed to a chair (including a wheelchair)?: A Little Help needed standing up from a chair using your arms (e.g., wheelchair or bedside chair)?: A Little Help needed to walk in hospital room?: A Lot Help needed climbing 3-5 steps with a railing? : Total 6 Click Score: 15    End of Session   Activity Tolerance: Patient tolerated treatment well Patient left: in bed;with call bell/phone within reach;with bed alarm set Nurse Communication: Mobility status;Other (comment) (elevated BP) PT Visit Diagnosis: Unsteadiness on feet (R26.81);Muscle weakness (generalized) (M62.81);Difficulty in walking, not elsewhere classified (R26.2)    Time: 8295-6213 PT Time Calculation (min) (ACUTE ONLY): 15 min   Charges:   PT Evaluation $PT Eval Moderate Complexity: 1 Mod   PT General Charges $$ ACUTE PT VISIT: 1 Visit         Janine Melbourne, PT, DPT Physical Therapist - [facility]   Dellar Traber A Rissie Sculley 09/24/2023, 10:40 AM

## 2023-09-25 LAB — ECHOCARDIOGRAM COMPLETE
AV Peak grad: 8.2 mmHg
Ao pk vel: 1.43 m/s
Height: 62 in
S' Lateral: 2.5 cm
Weight: 2081.14 [oz_av]

## 2023-10-19 ENCOUNTER — Emergency Department: Payer: Medicare (Managed Care)

## 2023-10-19 ENCOUNTER — Emergency Department
Admission: EM | Admit: 2023-10-19 | Discharge: 2023-10-20 | Disposition: A | Discharge status: Home / Self Care | Payer: Medicare (Managed Care) | Attending: Emergency Medicine | Admitting: Emergency Medicine

## 2023-10-19 ENCOUNTER — Other Ambulatory Visit: Payer: Self-pay

## 2023-10-19 DIAGNOSIS — N390 Urinary tract infection, site not specified: Secondary | ICD-10-CM | POA: Diagnosis not present

## 2023-10-19 DIAGNOSIS — R569 Unspecified convulsions: Secondary | ICD-10-CM

## 2023-10-19 DIAGNOSIS — F0390 Unspecified dementia without behavioral disturbance: Secondary | ICD-10-CM | POA: Insufficient documentation

## 2023-10-19 DIAGNOSIS — B9689 Other specified bacterial agents as the cause of diseases classified elsewhere: Secondary | ICD-10-CM | POA: Insufficient documentation

## 2023-10-19 DIAGNOSIS — I10 Essential (primary) hypertension: Secondary | ICD-10-CM | POA: Insufficient documentation

## 2023-10-19 DIAGNOSIS — Z79899 Other long term (current) drug therapy: Secondary | ICD-10-CM | POA: Diagnosis not present

## 2023-10-19 DIAGNOSIS — G40909 Epilepsy, unspecified, not intractable, without status epilepticus: Secondary | ICD-10-CM | POA: Diagnosis not present

## 2023-10-19 LAB — CBC WITH DIFFERENTIAL/PLATELET
Abs Immature Granulocytes: 0.02 10*3/uL (ref 0.00–0.07)
Basophils Absolute: 0 10*3/uL (ref 0.0–0.1)
Basophils Relative: 0 %
Eosinophils Absolute: 0.1 10*3/uL (ref 0.0–0.5)
Eosinophils Relative: 2 %
HCT: 34.3 % — ABNORMAL LOW (ref 36.0–46.0)
Hemoglobin: 10 g/dL — ABNORMAL LOW (ref 12.0–15.0)
Immature Granulocytes: 0 %
Lymphocytes Relative: 42 %
Lymphs Abs: 2 10*3/uL (ref 0.7–4.0)
MCH: 23 pg — ABNORMAL LOW (ref 26.0–34.0)
MCHC: 29.2 g/dL — ABNORMAL LOW (ref 30.0–36.0)
MCV: 78.9 fL — ABNORMAL LOW (ref 80.0–100.0)
Monocytes Absolute: 0.5 10*3/uL (ref 0.1–1.0)
Monocytes Relative: 11 %
Neutro Abs: 2.2 10*3/uL (ref 1.7–7.7)
Neutrophils Relative %: 45 %
Platelets: 215 10*3/uL (ref 150–400)
RBC: 4.35 MIL/uL (ref 3.87–5.11)
RDW: 14.6 % (ref 11.5–15.5)
WBC: 4.9 10*3/uL (ref 4.0–10.5)
nRBC: 0 % (ref 0.0–0.2)

## 2023-10-19 LAB — COMPREHENSIVE METABOLIC PANEL WITH GFR
ALT: 12 U/L (ref 0–44)
AST: 22 U/L (ref 15–41)
Albumin: 3.3 g/dL — ABNORMAL LOW (ref 3.5–5.0)
Alkaline Phosphatase: 44 U/L (ref 38–126)
Anion gap: 9 (ref 5–15)
BUN: 27 mg/dL — ABNORMAL HIGH (ref 8–23)
CO2: 22 mmol/L (ref 22–32)
Calcium: 9 mg/dL (ref 8.9–10.3)
Chloride: 108 mmol/L (ref 98–111)
Creatinine, Ser: 0.88 mg/dL (ref 0.44–1.00)
GFR, Estimated: >60 mL/min (ref >60)
Glucose, Bld: 88 mg/dL (ref 70–99)
Potassium: 4.4 mmol/L (ref 3.5–5.1)
Sodium: 139 mmol/L (ref 135–145)
Total Bilirubin: 0.7 mg/dL (ref 0.0–1.2)
Total Protein: 7.2 g/dL (ref 6.5–8.1)

## 2023-10-19 LAB — TROPONIN I (HIGH SENSITIVITY): Troponin I (High Sensitivity): 3 ng/L (ref <18)

## 2023-10-19 MED ORDER — LEVETIRACETAM (KEPPRA) 500 MG/5 ML ADULT IV PUSH
750.0000 mg | Freq: Once | INTRAVENOUS | Status: AC
  Administered 2023-10-19: 750 mg via INTRAVENOUS
  Filled 2023-10-19: qty 10

## 2023-10-19 NOTE — ED Provider Notes (Signed)
 [facility] Provider Note    Event Date/Time   First MD Initiated Contact with Patient 10/19/23 2301     (approximate)   History   Seizures   HPI  Level V caveat: limited by dementia  Tammy Clayton is a 89 y.o. female  brought to the ED via EMS from SNF with a chief complaint of probable seizure. Patient with a history of seizures on Keppra . Appeared post-ictal to staff members. Arrives to the ED alert and oriented voicing no medical complaints. Specifically denies fever/chills, chest pain, shortness of breath, abdominal pain, nausea, vomiting or dizziness.       Past Medical History   Past Medical History:  Diagnosis Date   Anemia    Dementia (HCC)    Depression    GERD (gastroesophageal reflux disease)    Hypertension    Leukopenia    chronic   Memory change    Palpitations    Respiratory difficulty    h/o failure   Seizures (HCC)      Active Problem List   Patient Active Problem List   Diagnosis Date Noted   Unresponsive episode 09/24/2023   Back pain 09/24/2023   Transient hypotension 09/24/2023   Cellulitis of right arm 11/02/2019   Acute confusion due to infection 11/02/2019   Dementia without behavioral disturbance (HCC) 11/02/2019   Essential hypertension 11/02/2019   Seizure disorder (HCC) 11/02/2019   Frequent falls 11/02/2019     Past Surgical History   Past Surgical History:  Procedure Laterality Date   CATARACT EXTRACTION W/PHACO Right 02/04/2015   Procedure: CATARACT EXTRACTION PHACO AND INTRAOCULAR LENS PLACEMENT (IOC);  Surgeon: Clair Crews, MD;  Location: [facility] ORS;  Service: Ophthalmology;  Laterality: Right;  us  1:16 ap 49.1 cde 19.71 fluid lot # [id number]   CATARACT EXTRACTION W/PHACO Left 04/29/2015   Procedure: CATARACT EXTRACTION PHACO AND INTRAOCULAR LENS PLACEMENT (IOC);  Surgeon: Clair Crews, MD;  Location: [facility] ORS;  Service: Ophthalmology;  Laterality: Left;  US  00:53 AP%  21.6 CDE 11.51 fluid pack lot # [id number]   EYE SURGERY       Home Medications   Prior to Admission medications   Medication Sig Start Date End Date Taking? Authorizing Provider  cephALEXin  (KEFLEX ) 250 MG capsule Take 1 capsule (250 mg total) by mouth 3 (three) times daily. 10/20/23  Yes Norlene Beavers, MD  acetaminophen  (TYLENOL ) 325 MG tablet Take 1,300 mg by mouth every 6 (six) hours as needed for mild pain (pain score 1-3) or moderate pain (pain score 4-6).    [provider]  cholecalciferol (VITAMIN D3) 25 MCG (1000 UNIT) tablet Take 1,000 Units by mouth daily.    [provider]  citalopram  (CELEXA ) 10 MG tablet Take 10 mg by mouth daily.    [provider]  GNP TUSSIN DM 20-200 MG/10ML liquid Take 10 mLs by mouth every 6 (six) hours as needed. 07/13/23   [provider]  levETIRAcetam  (KEPPRA ) 500 MG tablet Take 500 mg by mouth daily. 09/16/23   [provider]  levETIRAcetam  (KEPPRA ) 750 MG tablet Take 750 mg by mouth at bedtime.    [provider]  lisinopril  (ZESTRIL ) 30 MG tablet Take 30 mg by mouth daily.    [provider]  mirtazapine  (REMERON ) 7.5 MG tablet Take 7.5 mg by mouth at bedtime. 09/21/23   [provider]  omeprazole (PRILOSEC) 40 MG capsule Take 40 mg by mouth daily.    [provider]  ondansetron  (ZOFRAN -ODT) 4 MG disintegrating tablet Take 1 tablet (4 mg total) by mouth every 8 (eight) hours as needed for nausea or vomiting. 09/23/23   Bradler, Evan K, MD  polyethylene glycol (MIRALAX  / GLYCOLAX ) 17 g packet Take 17 g by mouth daily as needed for mild constipation or moderate constipation.    [provider]     Allergies  Patient has no known allergies.   Family History  History reviewed. No pertinent family history.   Physical Exam  Triage Vital Signs: ED Triage Vitals  Encounter Vitals Group     BP 10/19/23 2300 (!) 159/72     Systolic BP Percentile --       Diastolic BP Percentile --      Pulse Rate 10/19/23 2300 (!) 59     Resp 10/19/23 2305 14     Temp --      Temp src --      SpO2 10/19/23 2254 99 %     Weight --      Height --      Head Circumference --      Peak Flow --      Pain Score --      Pain Loc --      Pain Education --      Exclude from Growth Chart --     Updated Vital Signs: BP (!) 162/73   Pulse 65   Temp 97.9 F (36.6 C) (Oral)   Resp 15   SpO2 100%    General: Awake, no distress.  CV:  Mild bradycardia. Good peripheral perfusion.  Resp:  Normal effort. CTAB. Abd:  Nontender. No distention.  Other:  Alert to person and place. CNII-XII grossly intact. 5/5 motor strength and sensation all extremities. MAEx4. Did not bite tongue. No urinary incontinence.   ED Results / Procedures / Treatments  Labs (all labs ordered are listed, but only abnormal results are displayed) Labs Reviewed  CBC WITH DIFFERENTIAL/PLATELET - Abnormal; Notable for the following components:      Result Value   Hemoglobin 10.0 (*)    HCT 34.3 (*)    MCV 78.9 (*)    MCH 23.0 (*)    MCHC 29.2 (*)    All other components within normal limits  COMPREHENSIVE METABOLIC PANEL WITH GFR - Abnormal; Notable for the following components:   BUN 27 (*)    Albumin 3.3 (*)    All other components within normal limits  URINALYSIS, ROUTINE W REFLEX MICROSCOPIC - Abnormal; Notable for the following components:   Color, Urine YELLOW (*)    APPearance HAZY (*)    Nitrite POSITIVE (*)    Leukocytes,Ua SMALL (*)    Bacteria, UA RARE (*)    All other components within normal limits  URINE CULTURE  TROPONIN I (HIGH SENSITIVITY)  TROPONIN I (HIGH SENSITIVITY)     EKG  ED ECG REPORT I, Faven Watterson J, the attending physician, personally viewed and interpreted this ECG.   Date: 10/19/2023  EKG Time: 2340  Rate: 57  Rhythm: normal sinus rhythm  Axis: Normal  Intervals:none  ST&T Change: Nonspecific    RADIOLOGY I have independently  visualized and interpreted patient's imaging studies as well as noted the radiology interpretation:  Cxr: No acute cardiopulmonary process  CT Head: No ICH  Official radiology report(s): CT Head Wo Contrast Result Date: 10/19/2023 CLINICAL DATA:  Mental status changes. EXAM: CT HEAD WITHOUT CONTRAST TECHNIQUE: Contiguous axial images were obtained from the base of  the skull through the vertex without intravenous contrast. RADIATION DOSE REDUCTION: This exam was performed according to the departmental dose-optimization program which includes automated exposure control, adjustment of the mA and/or kV according to patient size and/or use of iterative reconstruction technique. COMPARISON:  09/24/2023 FINDINGS: Brain: There is atrophy and chronic small vessel disease changes. Old right frontal infarct, stable. Old left basal ganglia lacunar infarct. No acute intracranial abnormality. Specifically, no hemorrhage, hydrocephalus, mass lesion, acute infarction, or significant intracranial injury. Vascular: No hyperdense vessel or unexpected calcification. Skull: No acute calvarial abnormality. Sinuses/Orbits: No acute findings Other: None IMPRESSION: Atrophy, chronic microvascular disease. No acute intracranial abnormality. Old right frontal infarct.  Old left basal ganglia lacunar infarct. Electronically Signed   By: Janeece Mechanic M.D.   On: 10/19/2023 23:38   DG Chest Port 1 View Result Date: 10/19/2023 CLINICAL DATA:  Seizure EXAM: PORTABLE CHEST 1 VIEW COMPARISON:  08/29/2021 FINDINGS: The heart size and mediastinal contours are within normal limits. Aortic atherosclerosis. Both lungs are clear. The visualized skeletal structures are unremarkable. IMPRESSION: No active disease. Electronically Signed   By: Esmeralda Hedge M.D.   On: 10/19/2023 23:31     PROCEDURES:  Critical Care performed: No  .1-3 Lead EKG Interpretation  Performed by: Norlene Beavers, MD Authorized by: Norlene Beavers, MD      Interpretation: abnormal     ECG rate:  55   ECG rate assessment: bradycardic     Rhythm: sinus bradycardia     Ectopy: none     Conduction: normal   Comments:     Patient placed on cardiac monitor to evaluate for arrthymias    MEDICATIONS ORDERED IN ED: Medications  levETIRAcetam  (KEPPRA ) undiluted injection 750 mg (750 mg Intravenous Given 10/19/23 2327)  cefTRIAXone  (ROCEPHIN ) 1 g in sodium chloride  0.9 % 100 mL IVPB (0 g Intravenous Stopped 10/20/23 0149)     IMPRESSION / MDM / ASSESSMENT AND PLAN / ED COURSE  I reviewed the triage vital signs and the nursing notes.                             89 year old female sent for AMS, probable seizure. Differential diagnosis includes, but is not limited to, alcohol, illicit or prescription medications, or other toxic ingestion; intracranial pathology such as stroke or intracerebral hemorrhage; fever or infectious causes including sepsis; hypoxemia and/or hypercarbia; uremia; trauma; endocrine related disorders such as diabetes, hypoglycemia, and thyroid-related diseases; hypertensive encephalopathy; etc. I have personally reviewed patient's records and note a hospitalization 09/23/2023 for similar presentation.  Patient's presentation is most consistent with acute complicated illness / injury requiring diagnostic workup.  The patient is on the cardiac monitor to evaluate for evidence of arrhythmia and/or significant heart rate changes.  Will obtain labwork, urine, CT head. Administer pm Keppra  dose. Will reassess.  Clinical Course as of 10/20/23 0625  Thu Oct 20, 2023  0246 Laboratory results unremarkable including 2 sets of negative troponins. Patient resting in NAD, voices no medical complaints. Will discharge back to facility after completion of IV antibiotic. Strict return precautions given. Patient verbalizes understanding and agrees with plan of care. [JS]    Clinical Course User Index [JS] Norlene Beavers, MD     FINAL CLINICAL  IMPRESSION(S) / ED DIAGNOSES   Final diagnoses:  Seizure (HCC)  Lower urinary tract infectious disease     Rx / DC Orders   ED Discharge Orders  Ordered    cephALEXin  (KEFLEX ) 250 MG capsule  3 times daily        10/20/23 0158             Note:  This document was prepared using Dragon voice recognition software and may include unintentional dictation errors.   Rocky Gladden J, MD 10/20/23 [phone]

## 2023-10-19 NOTE — ED Triage Notes (Signed)
 Pt reportedly appeared postictal and would not respond to verbal commands. No seizure activity observed however has hx of seizures. Pt is alert on arrival. GCS 14 at baseline.

## 2023-10-20 LAB — URINALYSIS, ROUTINE W REFLEX MICROSCOPIC
Bilirubin Urine: NEGATIVE
Glucose, UA: NEGATIVE mg/dL
Hgb urine dipstick: NEGATIVE
Ketones, ur: NEGATIVE mg/dL
Nitrite: POSITIVE — AB
Protein, ur: NEGATIVE mg/dL
Specific Gravity, Urine: 1.023 (ref 1.005–1.030)
pH: 5 (ref 5.0–8.0)

## 2023-10-20 LAB — TROPONIN I (HIGH SENSITIVITY): Troponin I (High Sensitivity): 4 ng/L (ref <18)

## 2023-10-20 MED ORDER — SODIUM CHLORIDE 0.9 % IV SOLN
1.0000 g | Freq: Once | INTRAVENOUS | Status: AC
  Administered 2023-10-20: 1 g via INTRAVENOUS
  Filled 2023-10-20: qty 10

## 2023-10-20 MED ORDER — CEPHALEXIN 250 MG PO CAPS: 250.0000 mg | ORAL_CAPSULE | Freq: Three times a day (TID) | ORAL | 0 refills | Status: AC

## 2023-10-20 NOTE — Discharge Instructions (Signed)
 Take and finish antibiotic as prescribed. Return to the ER for recurrent or worsening symptoms, persistent vomiting, fever, difficulty breathing or other concerns.

## 2023-10-20 NOTE — ED Notes (Signed)
 Pt's Rn called this tech to put pt on bed pan to urinate, this tech went into room and placed pt on bedpan and took off pt's soiled brief, pt stated she was finished, however upon checking pt did not urinate at all. This proceeded to place new brief on pt and get pt back comfortable in bed. This tech will let pt's RN know.

## 2023-10-20 NOTE — ED Notes (Signed)
 Update provided to pt guardian [name] [phone])

## 2023-10-22 LAB — URINE CULTURE: Culture: >=100000 — AB

## 2023-10-23 NOTE — Progress Notes (Addendum)
 ED Antimicrobial Stewardship Positive Culture Follow Up   Tammy Clayton is an 89 y.o. female who presented to [facility] on 10/19/2023 with a chief complaint of  Chief Complaint  Patient presents with   Seizures    Recent Results (from the past 720 hours)  Resp panel by RT-PCR (RSV, Flu A&B, Covid) Anterior Nasal Swab     Status: None   Collection Time: 09/24/23 12:47 AM   Specimen: Anterior Nasal Swab  Result Value Ref Range Status   SARS Coronavirus 2 by RT PCR NEGATIVE NEGATIVE Final    Comment: (NOTE) SARS-CoV-2 target nucleic acids are NOT DETECTED.  The SARS-CoV-2 RNA is generally detectable in upper respiratory specimens during the acute phase of infection. The lowest concentration of SARS-CoV-2 viral copies this assay can detect is 138 copies/mL. A negative result does not preclude SARS-Cov-2 infection and should not be used as the sole basis for treatment or other patient management decisions. A negative result may occur with  improper specimen collection/handling, submission of specimen other than nasopharyngeal swab, presence of viral mutation(s) within the areas targeted by this assay, and inadequate number of viral copies(<138 copies/mL). A negative result must be combined with clinical observations, patient history, and epidemiological information. The expected result is Negative.  Fact Sheet for Patients:  BloggerCourse.com  Fact Sheet for Healthcare Providers:  SeriousBroker.it  This test is no t yet approved or cleared by the [place]  FDA and  has been authorized for detection and/or diagnosis of SARS-CoV-2 by FDA under an Emergency Use Authorization (EUA). This EUA will remain  in effect (meaning this test can be used) for the duration of the COVID-19 declaration under Section 564(b)(1) of the Act, 21 U.S.C.section 360bbb-3(b)(1), unless the authorization is terminated  or revoked sooner.        Influenza A by PCR NEGATIVE NEGATIVE Final   Influenza B by PCR NEGATIVE NEGATIVE Final    Comment: (NOTE) The Xpert Xpress SARS-CoV-2/FLU/RSV plus assay is intended as an aid in the diagnosis of influenza from Nasopharyngeal swab specimens and should not be used as a sole basis for treatment. Nasal washings and aspirates are unacceptable for Xpert Xpress SARS-CoV-2/FLU/RSV testing.  Fact Sheet for Patients: BloggerCourse.com  Fact Sheet for Healthcare Providers: SeriousBroker.it  This test is not yet approved or cleared by the [place]  FDA and has been authorized for detection and/or diagnosis of SARS-CoV-2 by FDA under an Emergency Use Authorization (EUA). This EUA will remain in effect (meaning this test can be used) for the duration of the COVID-19 declaration under Section 564(b)(1) of the Act, 21 U.S.C. section 360bbb-3(b)(1), unless the authorization is terminated or revoked.     Resp Syncytial Virus by PCR NEGATIVE NEGATIVE Final    Comment: (NOTE) Fact Sheet for Patients: BloggerCourse.com  Fact Sheet for Healthcare Providers: SeriousBroker.it  This test is not yet approved or cleared by the [place]  FDA and has been authorized for detection and/or diagnosis of SARS-CoV-2 by FDA under an Emergency Use Authorization (EUA). This EUA will remain in effect (meaning this test can be used) for the duration of the COVID-19 declaration under Section 564(b)(1) of the Act, 21 U.S.C. section 360bbb-3(b)(1), unless the authorization is terminated or revoked.  Performed at [facility], [address] [place], [place] [address]   Urine Culture     Status: Abnormal   Collection Time: 10/19/23 11:55 PM   Specimen: Urine, Random  Result Value Ref Range Status   Specimen Description  Final    URINE, RANDOM Performed at [facility], [address] [place], [place] [address]    Special Requests   Final    NONE Performed at [facility], [address] [place], [place] [address]    Culture (A)  Final    >=100,000 COLONIES/mL ESCHERICHIA COLI Confirmed Extended Spectrum Beta-Lactamase Producer (ESBL).  In bloodstream infections from ESBL organisms, carbapenems are preferred over piperacillin/tazobactam. They are shown to have a lower risk of mortality.    Report Status 10/22/2023 FINAL  Final   Organism ID, Bacteria ESCHERICHIA COLI (A)  Final      Susceptibility   Escherichia coli - MIC*    AMPICILLIN >=32 RESISTANT Resistant     CEFAZOLIN >=64 RESISTANT Resistant     CEFEPIME 1 SENSITIVE Sensitive     CEFTRIAXONE  >=64 RESISTANT Resistant     CIPROFLOXACIN >=4 RESISTANT Resistant     GENTAMICIN <=1 SENSITIVE Sensitive     IMIPENEM <=0.25 SENSITIVE Sensitive     NITROFURANTOIN <=16 SENSITIVE Sensitive     TRIMETH/SULFA <=20 SENSITIVE Sensitive     AMPICILLIN/SULBACTAM 4 SENSITIVE Sensitive     PIP/TAZO <=4 SENSITIVE Sensitive ug/mL    * >=100,000 COLONIES/mL ESCHERICHIA COLI    [x]  Treated with cephalexin , organism resistant to prescribed antimicrobial  Patient is a resident at [facility] [phone] Fax: Nurse station:  [phone] Fax: Main fax: [phone]  10/23/23:    attempted to fax culture results and fax has failed to go thru.  Spoke w/ Tammy Clayton at [facility] and she said the fax machines were having issues yesterday.  Spoke with RN on B-wing, Tammy Clayton, and gave verbal information on urine culture and RN will call oncall provider for facility  Dr Oval Blossom called and we discussed abx options given [facility] is closed today. She would like to try Bactrim initially and see if [facility] wants to switch to Fosfomycin or not.  Patient was not having fevers and per RN is at her recent baseline. MD will order.   Shamyah Stantz A 10/23/2023, 12:25 PM Clinical Pharmacist

## 2023-12-16 ENCOUNTER — Emergency Department: Admission: EM | Admit: 2023-12-16 | Discharge: 2023-12-16 | Disposition: A | Discharge status: Home / Self Care

## 2023-12-16 ENCOUNTER — Emergency Department

## 2023-12-16 ENCOUNTER — Other Ambulatory Visit: Payer: Self-pay

## 2023-12-16 DIAGNOSIS — G40909 Epilepsy, unspecified, not intractable, without status epilepticus: Secondary | ICD-10-CM | POA: Diagnosis not present

## 2023-12-16 DIAGNOSIS — N3000 Acute cystitis without hematuria: Secondary | ICD-10-CM | POA: Insufficient documentation

## 2023-12-16 DIAGNOSIS — R569 Unspecified convulsions: Secondary | ICD-10-CM | POA: Diagnosis present

## 2023-12-16 DIAGNOSIS — R791 Abnormal coagulation profile: Secondary | ICD-10-CM | POA: Insufficient documentation

## 2023-12-16 DIAGNOSIS — F0390 Unspecified dementia without behavioral disturbance: Secondary | ICD-10-CM | POA: Insufficient documentation

## 2023-12-16 DIAGNOSIS — Y92129 Unspecified place in nursing home as the place of occurrence of the external cause: Secondary | ICD-10-CM | POA: Diagnosis not present

## 2023-12-16 DIAGNOSIS — I10 Essential (primary) hypertension: Secondary | ICD-10-CM | POA: Diagnosis not present

## 2023-12-16 DIAGNOSIS — W19XXXA Unspecified fall, initial encounter: Secondary | ICD-10-CM | POA: Insufficient documentation

## 2023-12-16 LAB — URINALYSIS, COMPLETE (UACMP) WITH MICROSCOPIC
Bilirubin Urine: NEGATIVE
Glucose, UA: NEGATIVE mg/dL
Hgb urine dipstick: NEGATIVE
Ketones, ur: NEGATIVE mg/dL
Leukocytes,Ua: NEGATIVE
Nitrite: POSITIVE — AB
Protein, ur: NEGATIVE mg/dL
Specific Gravity, Urine: 1.015 (ref 1.005–1.030)
pH: 5 (ref 5.0–8.0)

## 2023-12-16 LAB — CBC WITH DIFFERENTIAL/PLATELET
Abs Immature Granulocytes: 0.02 K/uL (ref 0.00–0.07)
Basophils Absolute: 0 K/uL (ref 0.0–0.1)
Basophils Relative: 0 %
Eosinophils Absolute: 0.1 K/uL (ref 0.0–0.5)
Eosinophils Relative: 1 %
HCT: 37.2 % (ref 36.0–46.0)
Hemoglobin: 10.8 g/dL — ABNORMAL LOW (ref 12.0–15.0)
Immature Granulocytes: 1 %
Lymphocytes Relative: 33 %
Lymphs Abs: 1.4 K/uL (ref 0.7–4.0)
MCH: 22.5 pg — ABNORMAL LOW (ref 26.0–34.0)
MCHC: 29 g/dL — ABNORMAL LOW (ref 30.0–36.0)
MCV: 77.5 fL — ABNORMAL LOW (ref 80.0–100.0)
Monocytes Absolute: 0.4 K/uL (ref 0.1–1.0)
Monocytes Relative: 9 %
Neutro Abs: 2.5 K/uL (ref 1.7–7.7)
Neutrophils Relative %: 56 %
Platelets: 174 K/uL (ref 150–400)
RBC: 4.8 MIL/uL (ref 3.87–5.11)
RDW: 15.9 % — ABNORMAL HIGH (ref 11.5–15.5)
WBC: 4.3 K/uL (ref 4.0–10.5)
nRBC: 0 % (ref 0.0–0.2)

## 2023-12-16 LAB — COMPREHENSIVE METABOLIC PANEL WITH GFR
ALT: 13 U/L (ref 0–44)
AST: 24 U/L (ref 15–41)
Albumin: 3.8 g/dL (ref 3.5–5.0)
Alkaline Phosphatase: 61 U/L (ref 38–126)
Anion gap: 13 (ref 5–15)
BUN: 23 mg/dL (ref 8–23)
CO2: 23 mmol/L (ref 22–32)
Calcium: 9.9 mg/dL (ref 8.9–10.3)
Chloride: 104 mmol/L (ref 98–111)
Creatinine, Ser: 1 mg/dL (ref 0.44–1.00)
GFR, Estimated: 54 mL/min — ABNORMAL LOW (ref >60)
Glucose, Bld: 103 mg/dL — ABNORMAL HIGH (ref 70–99)
Potassium: 3.9 mmol/L (ref 3.5–5.1)
Sodium: 140 mmol/L (ref 135–145)
Total Bilirubin: 0.7 mg/dL (ref 0.0–1.2)
Total Protein: 7.9 g/dL (ref 6.5–8.1)

## 2023-12-16 LAB — LIPASE, BLOOD: Lipase: 65 U/L — ABNORMAL HIGH (ref 11–51)

## 2023-12-16 LAB — PROTIME-INR
INR: 1.1 (ref 0.8–1.2)
Prothrombin Time: 14.5 s (ref 11.4–15.2)

## 2023-12-16 MED ORDER — LEVETIRACETAM (KEPPRA) 500 MG/5 ML ADULT IV PUSH
500.0000 mg | Freq: Once | INTRAVENOUS | Status: AC
  Administered 2023-12-16: 500 mg via INTRAVENOUS
  Filled 2023-12-16: qty 5

## 2023-12-16 MED ORDER — CEPHALEXIN 500 MG PO CAPS: 500.0000 mg | ORAL_CAPSULE | Freq: Two times a day (BID) | ORAL | 0 refills | Status: AC

## 2023-12-16 MED ORDER — SODIUM CHLORIDE 0.9 % IV SOLN
1.0000 g | Freq: Once | INTRAVENOUS | Status: AC
  Administered 2023-12-16: 1 g via INTRAVENOUS
  Filled 2023-12-16: qty 10

## 2023-12-16 NOTE — ED Provider Notes (Signed)
 [facility] Provider Note    Event Date/Time   First MD Initiated Contact with Patient 12/16/23 [phone]     (approximate)   History   Fall  Pt comes via EMs from [facility] with c/o fall and seizure. Pt does have hx of seizures and on meds. Unsure if pt fell or had seizure first. Seizure was witnessed by nurse but nurse was not on scene when EMs arrived.   Pt did hit her head. Bump on head is old. Pt not on thinners. Pt denies any loc. Pt denies any complaints.   Pt has dementia and at baseline.    HPI Tammy Clayton is a 89 y.o. female PMH dementia, seizure disorder, frequent falls, hypertension presents from SNF for evaluation of likely seizure -Patient was reported to be found on the ground seizing.  Does have a history of prior seizures, is on Keppra  for this.  Unclear if she fell and then seized or seized and then fell.  Did hit her head. -Glucose normal per EMS, vital signs stable, afebrile -Not on anticoagulation per accompanying paperwork - Patient reportedly at baseline mental state, somewhat sleepy on arrival  Per chart review, last seen in our emergency department on 10/19/2023 after likely seizure, unremarkable workup at that time.  AO x 2 at that time.  Workup unremarkable other than evidence of UTI, treated with antibiotics.  Discharged back to facility.     Physical Exam   Triage Vital Signs: BP (!) 153/75   Pulse 64   Temp 97.6 F (36.4 C) (Oral)   Resp 20   Ht 5' 4 (1.626 m)   Wt 61.2 kg   SpO2 95%   BMI 23.17 kg/m     Most recent vital signs: Vitals:   12/16/23 0744 12/16/23 0939  BP:  (!) 153/75  Pulse: 64   Resp: 20   Temp: 97.6 F (36.4 C)   SpO2: 95%      General: Awake, no distress.  HEENT: Cephalic, atraumatic, no midline neck pain CV:  Good peripheral perfusion. RRR, RP 2+ Resp:  Normal effort. CTAB Abd:  No distention. Nontender to deep palpation throughout Neuro:  Initially minimally reactive  though shortly after this conversive, able to tell me her name, moving all extremities spontaneously, no focal motor deficit appreciated Other:  No tenderness to palpation or deformities throughout bilateral upper and lower extremities.  No pain to palpation of pelvis.   ED Results / Procedures / Treatments   Labs (all labs ordered are listed, but only abnormal results are displayed) Labs Reviewed  COMPREHENSIVE METABOLIC PANEL WITH GFR - Abnormal; Notable for the following components:      Result Value   Glucose, Bld 103 (*)    GFR, Estimated 54 (*)    All other components within normal limits  LIPASE, BLOOD - Abnormal; Notable for the following components:   Lipase 65 (*)    All other components within normal limits  CBC WITH DIFFERENTIAL/PLATELET - Abnormal; Notable for the following components:   Hemoglobin 10.8 (*)    MCV 77.5 (*)    MCH 22.5 (*)    MCHC 29.0 (*)    RDW 15.9 (*)    All other components within normal limits  URINALYSIS, COMPLETE (UACMP) WITH MICROSCOPIC - Abnormal; Notable for the following components:   Color, Urine YELLOW (*)    APPearance HAZY (*)    Nitrite POSITIVE (*)    Bacteria, UA MANY (*)  All other components within normal limits  PROTIME-INR     EKG  Ecg = sinus rhythm, rate 62, no acute ST elevation or depression, no significant repolarization abnormality, left axis deviation, normal intervals.  No clear evidence of ischemia nor arrhythmia on my interpretation.   RADIOLOGY Neurology interpreted by myself and radiology report reviewed.  No acute pathology identified.    PROCEDURES:  Critical Care performed: No  Procedures   MEDICATIONS ORDERED IN ED: Medications  levETIRAcetam  (KEPPRA ) undiluted injection 500 mg (500 mg Intravenous Given 12/16/23 0935)  cefTRIAXone  (ROCEPHIN ) 1 g in sodium chloride  0.9 % 100 mL IVPB (1 g Intravenous New Bag/Given 12/16/23 1011)     IMPRESSION / MDM / ASSESSMENT AND PLAN / ED COURSE  I reviewed  the triage vital signs and the nursing notes.                              DDX/MDM/AP: Differential diagnosis includes, but is not limited to, seizure in this patient with a known seizure disorder, consider underlying metabolic abnormality or infection.  Consider intracranial hemorrhage, skull fracture, C-spine injury secondary to fall or preceding seizure.  Fortunately does appear to have returned to baseline mental status shortly after arrival to emergency department .   Plan: - Labs - CT head, CT C-spine - Collateral later gathered from facility and, had not yet taken her morning Keppra  dose--loaded here  Patient's presentation is most consistent with acute presentation with potential threat to life or bodily function.  The patient is on the cardiac monitor to evaluate for evidence of arrhythmia and/or significant heart rate changes.  ED course below.  Workup with underlying UTI, suspect this lowered seizure threshold, treating with ceftriaxone .  Return to baseline mental state.  No intracranial pathology or traumatic injuries discovered.  Stable for discharge home, recommend PMD follow-up, can consider titration of antiepileptics as outpatient.  Discharged with Keflex  neck to her facility.  Clinical Course as of 12/16/23 1211  Fri Dec 16, 2023  0814 CBC with stable mild anemia, no leukocytosis  CMP unremarkable, lipase unremarkable [MM]  0905 CTH, CTCspine: IMPRESSION: 1. No acute brain injury. 2. Chronic ischemic microvascular disease and old right frontal infarct. Old left basal ganglia lacunar infarct. 3. No acute cervical spine injury. 4. Moderate spondylosis throughout the cervical spine with moderate disc disease at the C5-6 and C6-7 levels. Minimal canal stenosis at the C5-6 and C6-7 levels due to bony spurring. Significant multilevel bilateral neural foraminal narrowing as described.   [MM]  0915 Patient reevaluated, more interactive, able to tell me her name and answer  basic questions.  No complaints at this time.  Does not remember having fallen. [MM]  [phone] Spoke w/  History of prior seizures Did not  [MM]  0924 D/w nurse at pt's facility, [facility] - No seizure today, does have frequent seizures -Did not yet take her morning Keppra  dose -Quite confused at baseline [MM]  1001 Urinalysis with no significant pyuria though many bacteria and nitrite positive concerning for possible UTI  Does appear patient has had seizures in the setting of UTIs previously, will treat empirically [MM]  1206 Patient with no further seizures here, has returned to baseline mental state, workup reassuring beyond underlying UTI, suspect this lowered seizure threshold.  Loaded with Keppra  with plan to continue her antiepileptics at home.  Stable for discharge home from my perspective with outpatient follow-up.  Treating UTI with Keflex . [  MM]    Clinical Course User Index [MM] Clarine Ozell LABOR, MD     FINAL CLINICAL IMPRESSION(S) / ED DIAGNOSES   Final diagnoses:  Acute cystitis without hematuria  Seizure (HCC)  Fall, initial encounter     Rx / DC Orders   ED Discharge Orders          Ordered    cephALEXin  (KEFLEX ) 500 MG capsule  2 times daily        12/16/23 1210             Note:  This document was prepared using Dragon voice recognition software and may include unintentional dictation errors.   Clarine Ozell LABOR, MD 12/16/23 [phone]

## 2023-12-16 NOTE — ED Notes (Signed)
 Pt up to toilet. Pt cleaned up and clean brief placed. Pt had runny BM. Pt assisted back into back and placed back on monitor.

## 2023-12-16 NOTE — ED Notes (Signed)
 Gave report to buffy at [facility]

## 2023-12-16 NOTE — Discharge Instructions (Signed)
 Your evaluation in the emergency department was notable for urinary tract infection, and this may have increased the likelihood that you had a seizure.  It sounds as if you had a seizure, and should continue with your regular seizure medications and follow-up with your primary care provider for reevaluation.  They can also consider whether you may benefit from medication increases at that time.  Return to the emergency department with any new or worsening symptoms.

## 2023-12-16 NOTE — ED Notes (Signed)
 Attempted to call Rollene Flatter, pt's legal guardian to let her know pt will be discharged back to facility. Phone went to voicemail

## 2023-12-16 NOTE — ED Triage Notes (Signed)
 Pt comes [facility] from [facility] [place] with c/o fall and seizure. Pt does have hx of seizures and on meds. Unsure if pt fell or had seizure first. Seizure was witnessed by nurse but nurse was not on scene when EMs arrived.   Pt did hit her head. Bump on head is old. Pt not on thinners. Pt denies any loc. Pt denies any complaints.   Pt has dementia and at baseline.

## 2023-12-16 NOTE — ED Notes (Signed)
 Pt legal guardian aware pt is here.

## 2024-01-19 IMAGING — DX DG CHEST 1V PORT
1 series · 1 of 1 positions shown · non-contrast
Comparison: 10/13/2018

CLINICAL DATA: Rule out pneumonia.  Altered mental status

EXAM:
PORTABLE CHEST 1 VIEW

[chest ap]
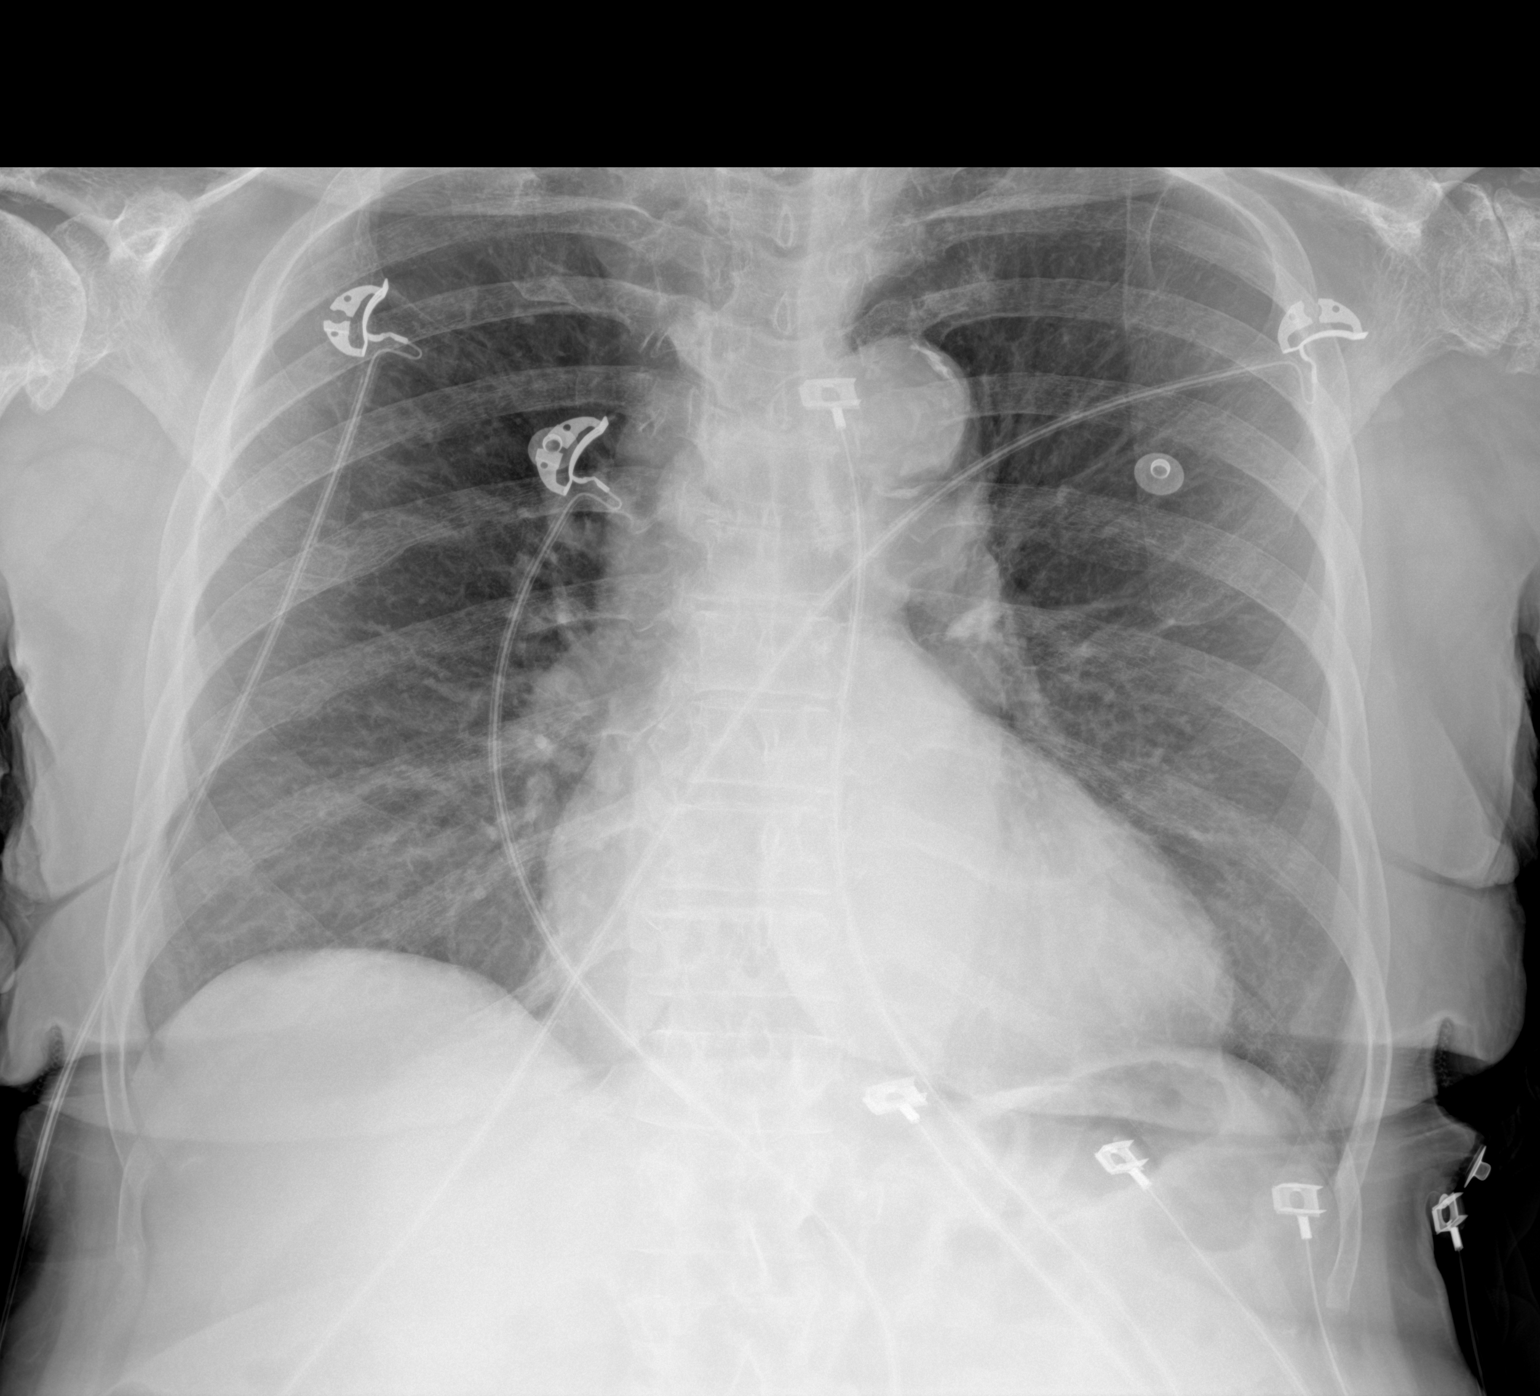

[1 of 1 positions shown; findings below may reference images not displayed]

FINDINGS: Borderline heart size for technique. Stable mediastinal contours.
Mild interstitial coarsening which is stable. There is no edema,
consolidation, effusion, or pneumothorax.
IMPRESSION: Stable exam.  No evidence for pneumonia.

## 2024-01-19 IMAGING — CT CT HEAD W/O CM
4 series · 16 of 47 positions shown, 18 images · non-contrast
Comparison: Head CT 11/16/2019.

CLINICAL DATA: 89-year-old female with history of altered mental
status.



[Series 2: head wo · axial · 0.43mm/px · z∈[-186,-51]mm · 7 of 37 slices shown, 9 images]
[im 5/37  brain]
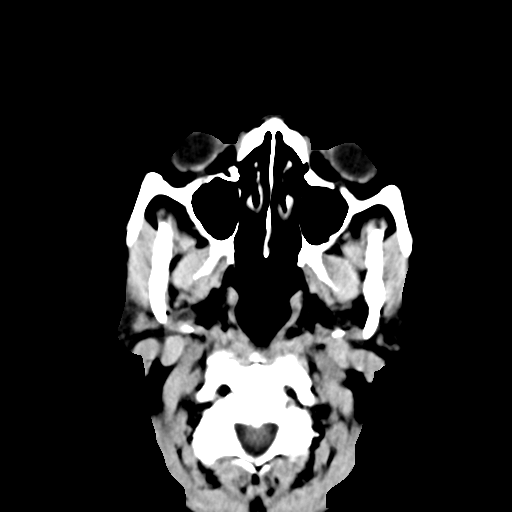
[im 5/37  bone]
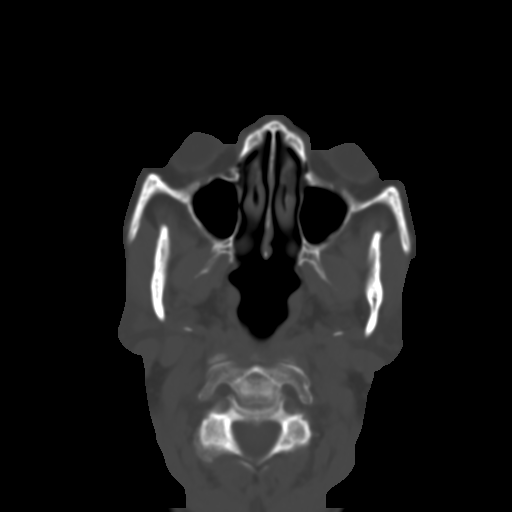
[im 10/37  brain]
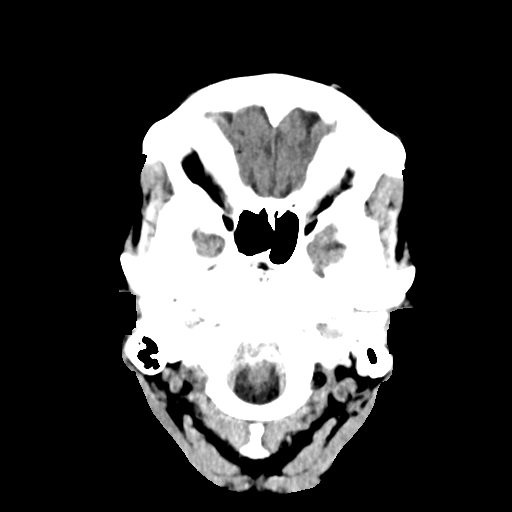
[im 14/37  brain]
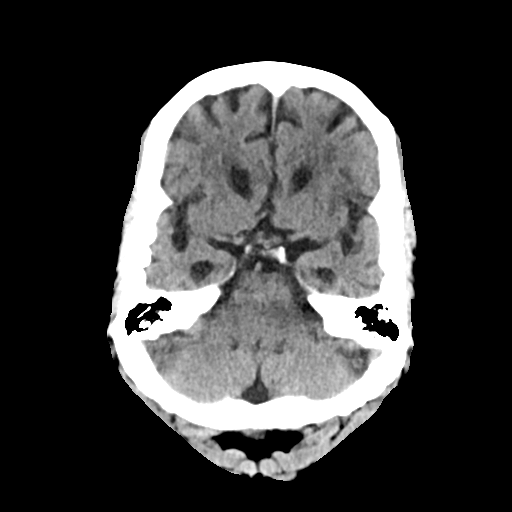
[im 19/37  brain]
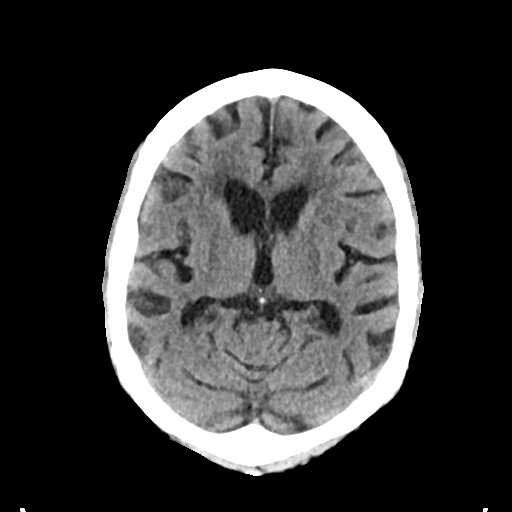
[im 23/37  brain]
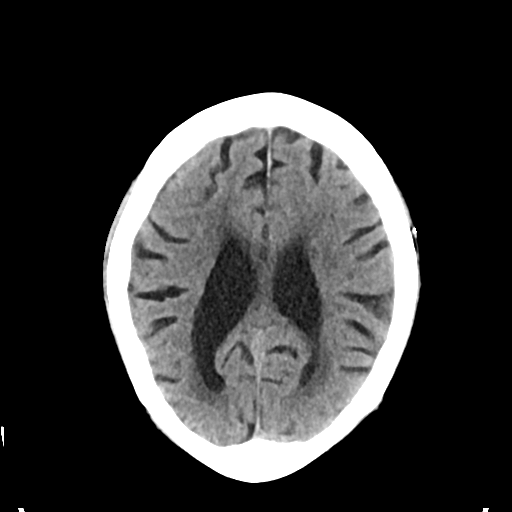
[im 23/37  bone]
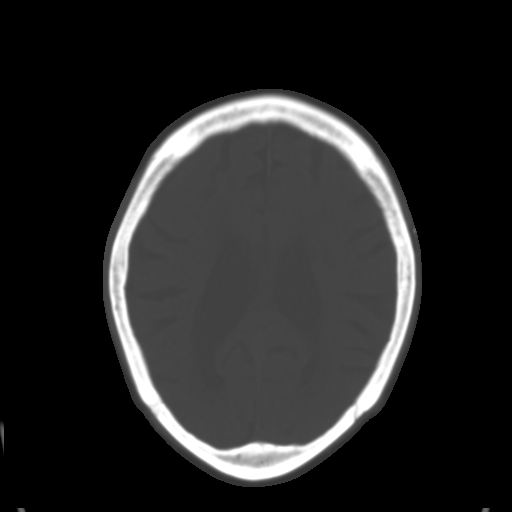
[im 28/37  brain]
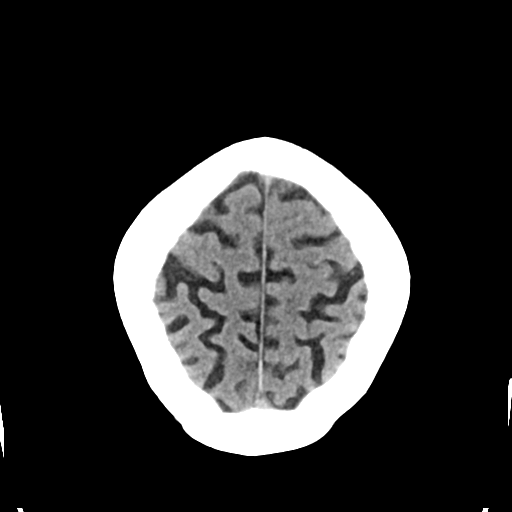
[im 32/37  brain]
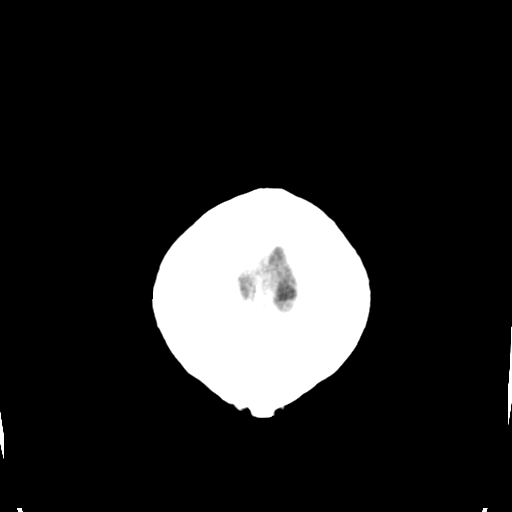

[Series 3: head bone · axial · 0.43mm/px · z∈[-188,-152]mm · 3 of 92 slices shown]
[im 10/92  bone]
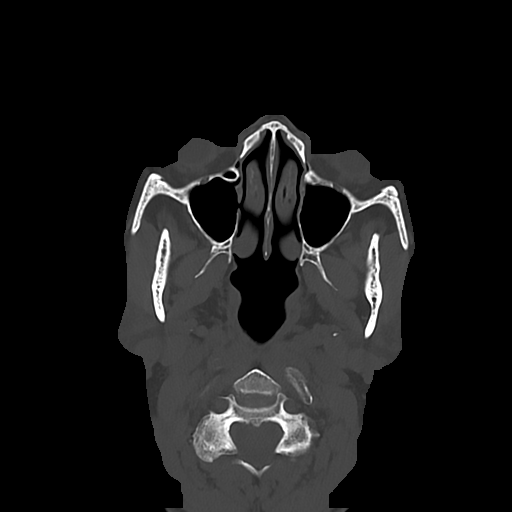
[im 19/92  bone]
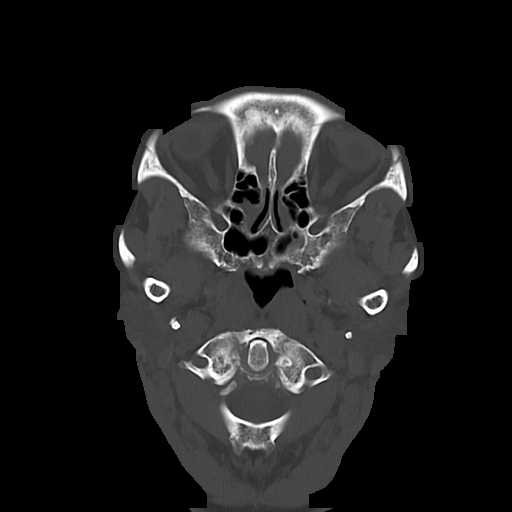
[im 28/92  bone]
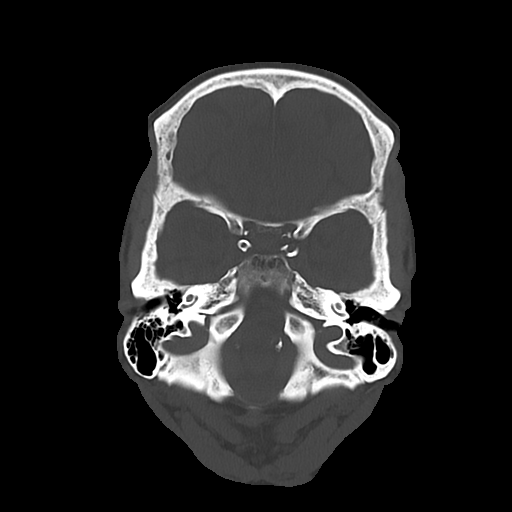

[Series 4: cor soft · coronal · 0.34mm/px · 3 of 67 slices shown]
[im 25/67  brain]
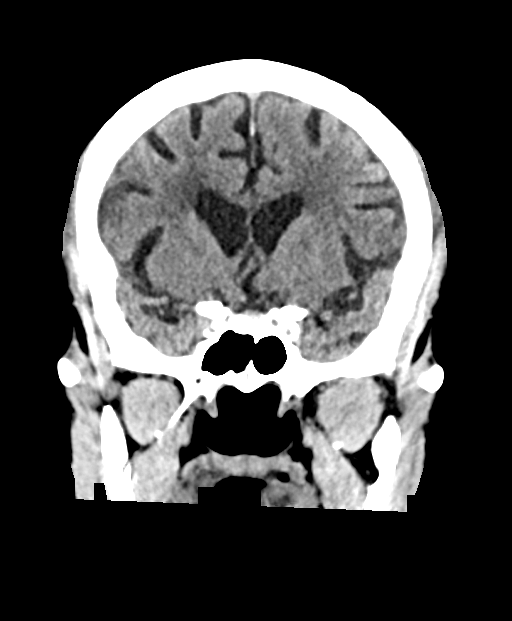
[im 31/67  brain]
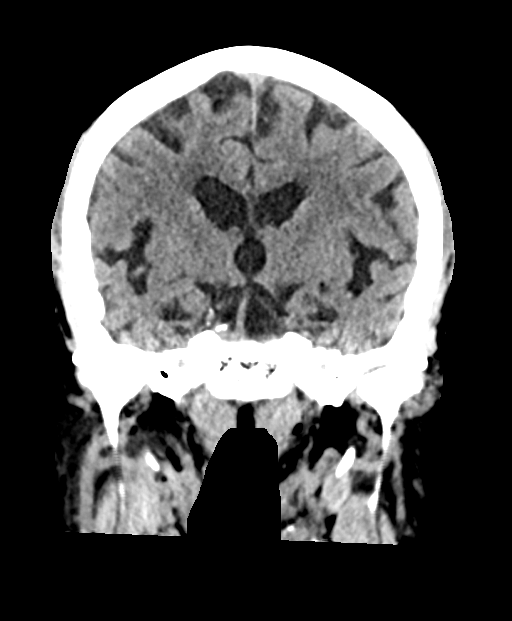
[im 36/67  brain]
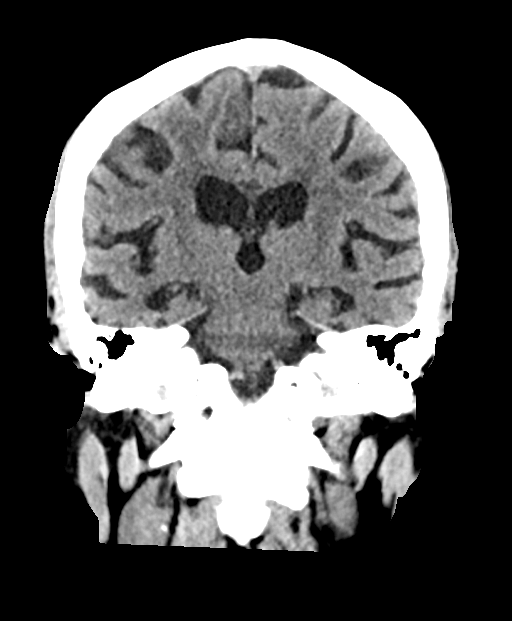

[Series 5: sag soft · sagittal · 0.38mm/px · 3 of 58 slices shown]
[im 20/58  brain]
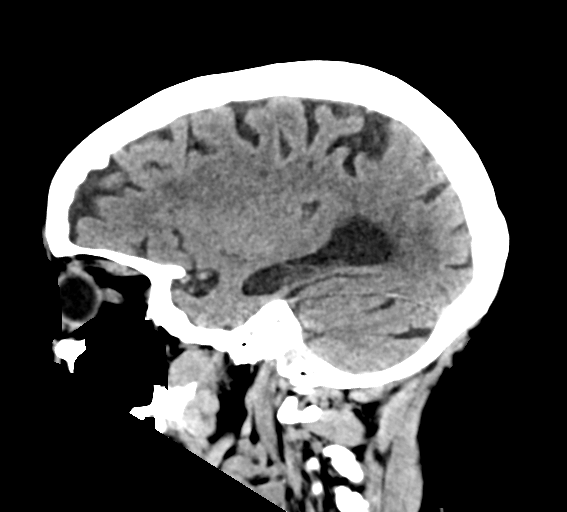
[im 29/58  brain]
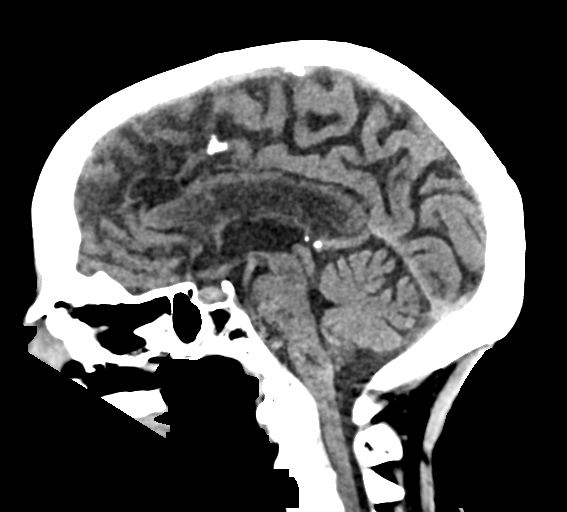
[im 39/58  brain]
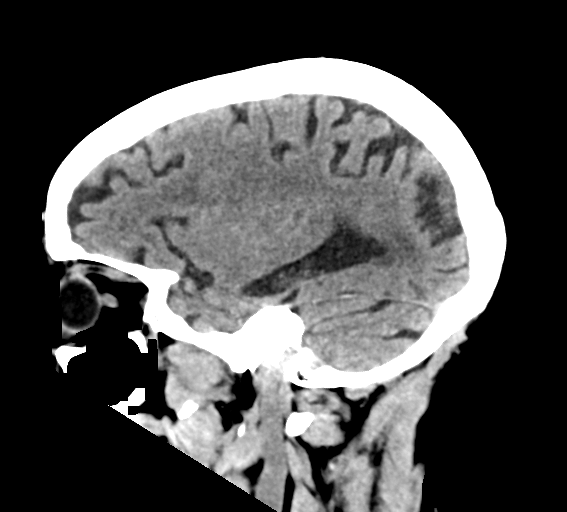

[16 of 47 positions shown; findings below may reference images not displayed]

FINDINGS: Brain: Moderate cerebral and mild cerebellar atrophy. Patchy and
confluent areas of decreased attenuation are noted throughout the
deep and periventricular white matter of the cerebral hemispheres
bilaterally, compatible with chronic microvascular ischemic disease.
No evidence of acute infarction, hemorrhage, hydrocephalus,
extra-axial collection or mass lesion/mass effect.

Vascular: No hyperdense vessel or unexpected calcification.

Skull: Normal. Negative for fracture or focal lesion.

Sinuses/Orbits: No acute finding.

Other: None.
IMPRESSION: 1. No acute intracranial abnormalities.
2. Moderate cerebral and mild cerebellar atrophy with chronic
microvascular ischemic changes in the cerebral white matter, as
above.
